# Patient Record
Sex: Female | Born: 1980 | Race: White | Hispanic: No | Marital: Married | Smoking: Never smoker
Health system: Southern US, Community
[De-identification: ages and names within clinical notes are randomized; demographics above are authoritative.]

## PROBLEM LIST (undated history)

## (undated) DIAGNOSIS — E079 Disorder of thyroid, unspecified: Secondary | ICD-10-CM

---

## 2004-06-18 ENCOUNTER — Observation Stay: Payer: Self-pay

## 2005-07-01 HISTORY — PX: CHOLECYSTECTOMY: SHX55

## 2007-01-21 ENCOUNTER — Ambulatory Visit: Payer: Self-pay | Admitting: Internal Medicine

## 2007-09-30 ENCOUNTER — Ambulatory Visit: Payer: Self-pay

## 2007-10-07 ENCOUNTER — Ambulatory Visit: Payer: Self-pay

## 2008-11-07 ENCOUNTER — Ambulatory Visit: Payer: Self-pay | Admitting: General Surgery

## 2009-05-02 HISTORY — PX: BREAST BIOPSY: SHX20

## 2010-02-07 ENCOUNTER — Encounter: Payer: Self-pay | Admitting: Specialist

## 2010-02-20 ENCOUNTER — Other Ambulatory Visit: Payer: Self-pay

## 2010-03-01 ENCOUNTER — Encounter: Payer: Self-pay | Admitting: Specialist

## 2010-03-31 ENCOUNTER — Encounter: Payer: Self-pay | Admitting: Specialist

## 2010-08-30 ENCOUNTER — Ambulatory Visit: Payer: Self-pay | Admitting: General Practice

## 2010-08-30 ENCOUNTER — Other Ambulatory Visit: Payer: Self-pay | Admitting: Physician Assistant

## 2010-10-01 ENCOUNTER — Other Ambulatory Visit: Payer: Self-pay

## 2010-11-06 ENCOUNTER — Ambulatory Visit: Payer: Self-pay | Admitting: Unknown Physician Specialty

## 2010-11-08 ENCOUNTER — Ambulatory Visit: Payer: Self-pay | Admitting: Unknown Physician Specialty

## 2012-12-03 ENCOUNTER — Emergency Department: Payer: Self-pay | Admitting: Emergency Medicine

## 2012-12-05 LAB — BETA STREP CULTURE(ARMC)

## 2012-12-26 ENCOUNTER — Inpatient Hospital Stay: Payer: Self-pay

## 2012-12-26 LAB — CBC WITH DIFFERENTIAL/PLATELET
Basophil #: 0.1 10*3/uL (ref 0.0–0.1)
Basophil %: 0.8 %
HCT: 31 % — ABNORMAL LOW (ref 35.0–47.0)
HGB: 10.3 g/dL — ABNORMAL LOW (ref 12.0–16.0)
Lymphocyte #: 2.4 10*3/uL (ref 1.0–3.6)
MCH: 25 pg — ABNORMAL LOW (ref 26.0–34.0)
MCV: 75 fL — ABNORMAL LOW (ref 80–100)
Monocyte %: 6.6 %
Neutrophil %: 65.3 %
Platelet: 246 10*3/uL (ref 150–440)
RBC: 4.14 10*6/uL (ref 3.80–5.20)
RDW: 15.5 % — ABNORMAL HIGH (ref 11.5–14.5)

## 2013-01-10 LAB — HM PAP SMEAR: HM Pap smear: NORMAL

## 2013-03-10 ENCOUNTER — Other Ambulatory Visit: Payer: Self-pay | Admitting: Obstetrics and Gynecology

## 2013-03-10 LAB — TSH: Thyroid Stimulating Horm: 0.023 u[IU]/mL — ABNORMAL LOW

## 2013-03-10 LAB — T4, FREE: Free Thyroxine: 1.43 ng/dL (ref 0.76–1.46)

## 2013-05-31 ENCOUNTER — Other Ambulatory Visit: Payer: Self-pay | Admitting: Obstetrics and Gynecology

## 2013-09-24 ENCOUNTER — Other Ambulatory Visit: Payer: Self-pay | Admitting: Obstetrics and Gynecology

## 2013-09-24 LAB — TSH: Thyroid Stimulating Horm: 6.57 u[IU]/mL — ABNORMAL HIGH

## 2013-09-24 LAB — T4, FREE: Free Thyroxine: 0.99 ng/dL (ref 0.76–1.46)

## 2013-12-03 ENCOUNTER — Other Ambulatory Visit: Payer: Self-pay | Admitting: Obstetrics and Gynecology

## 2013-12-03 LAB — TSH: Thyroid Stimulating Horm: 0.958 u[IU]/mL

## 2013-12-03 LAB — T4, FREE: Free Thyroxine: 1.39 ng/dL (ref 0.76–1.46)

## 2014-06-16 ENCOUNTER — Ambulatory Visit: Payer: Self-pay | Admitting: Family Medicine

## 2014-06-16 LAB — LIPID PANEL
Cholesterol: 151 mg/dL (ref 0–200)
HDL Cholesterol: 49 mg/dL (ref 40–60)
Ldl Cholesterol, Calc: 69 mg/dL (ref 0–100)
TRIGLYCERIDES: 164 mg/dL (ref 0–200)
VLDL Cholesterol, Calc: 33 mg/dL (ref 5–40)

## 2014-06-16 LAB — CBC WITH DIFFERENTIAL/PLATELET
BASOS PCT: 0.7 %
Basophil #: 0.1 10*3/uL (ref 0.0–0.1)
Eosinophil #: 0.2 10*3/uL (ref 0.0–0.7)
Eosinophil %: 2.5 %
HCT: 39.5 % (ref 35.0–47.0)
HGB: 13.4 g/dL (ref 12.0–16.0)
LYMPHS PCT: 40.5 %
Lymphocyte #: 3.1 10*3/uL (ref 1.0–3.6)
MCH: 29.3 pg (ref 26.0–34.0)
MCHC: 33.8 g/dL (ref 32.0–36.0)
MCV: 87 fL (ref 80–100)
Monocyte #: 0.5 x10 3/mm (ref 0.2–0.9)
Monocyte %: 6.2 %
NEUTROS ABS: 3.8 10*3/uL (ref 1.4–6.5)
Neutrophil %: 50.1 %
Platelet: 326 10*3/uL (ref 150–440)
RBC: 4.56 10*6/uL (ref 3.80–5.20)
RDW: 13.6 % (ref 11.5–14.5)
WBC: 7.7 10*3/uL (ref 3.6–11.0)

## 2014-06-16 LAB — TSH: THYROID STIMULATING HORM: 0.718 u[IU]/mL

## 2014-10-04 ENCOUNTER — Encounter: Payer: Self-pay | Admitting: *Deleted

## 2015-04-12 ENCOUNTER — Encounter: Payer: Self-pay | Admitting: Physician Assistant

## 2015-04-12 ENCOUNTER — Ambulatory Visit: Payer: Self-pay | Admitting: Family

## 2015-04-12 VITALS — BP 126/77 | Temp 98.7°F | Wt 185.0 lb

## 2015-04-12 DIAGNOSIS — J069 Acute upper respiratory infection, unspecified: Secondary | ICD-10-CM

## 2015-04-12 NOTE — Progress Notes (Signed)
S/ acute onset of cold sxs x 2 days , sneezing , rhinorhea , eyes watering, malaise, took the flu shot last week Denies fever chill, GI or GU sxs  O/  VSS mildly ill appearing , ENT tms clear, nasal mucosa boggy with increased clear mucous, pharynx injected Neck supple  Without nodes, Heart rsr lungs clear A/ uri  P / note for home rest of day , She will be off until next week. Supportive measures .

## 2015-05-08 ENCOUNTER — Encounter: Payer: Self-pay | Admitting: Physician Assistant

## 2015-05-08 ENCOUNTER — Ambulatory Visit: Payer: Self-pay | Admitting: Family

## 2015-05-08 VITALS — BP 110/60 | HR 100 | Temp 97.8°F

## 2015-05-08 DIAGNOSIS — L247 Irritant contact dermatitis due to plants, except food: Secondary | ICD-10-CM

## 2015-05-08 MED ORDER — PREDNISONE 10 MG (48) PO TBPK: ORAL_TABLET | Freq: Every day | ORAL | Status: DC

## 2015-05-08 NOTE — Progress Notes (Signed)
S cutting wood a week ago , breaking out to arms and legs getting worse, very pruritic  , has a prior hx of allergy to poison but did not see any, tried topicals without relief  O/ red papulovesicular lesions in crops flexor surfaces of elbows , forearms and inner thighs  A/ contact derm P/sterapred pack x 12 days with instructions. Cut nails.

## 2015-06-09 ENCOUNTER — Other Ambulatory Visit
Admission: RE | Admit: 2015-06-09 | Discharge: 2015-06-09 | Disposition: A | Discharge status: Home / Self Care | Payer: 59 | Source: Ambulatory Visit | Attending: Obstetrics and Gynecology | Admitting: Obstetrics and Gynecology

## 2015-06-09 DIAGNOSIS — E039 Hypothyroidism, unspecified: Secondary | ICD-10-CM | POA: Diagnosis not present

## 2015-06-09 LAB — T4, FREE: Free T4: 0.94 ng/dL (ref 0.61–1.12)

## 2015-06-09 LAB — TSH: TSH: 2.645 u[IU]/mL (ref 0.350–4.500)

## 2015-07-27 ENCOUNTER — Other Ambulatory Visit
Admission: RE | Admit: 2015-07-27 | Discharge: 2015-07-27 | Disposition: A | Discharge status: Home / Self Care | Payer: 59 | Source: Ambulatory Visit | Attending: Obstetrics and Gynecology | Admitting: Obstetrics and Gynecology

## 2015-07-27 ENCOUNTER — Other Ambulatory Visit: Payer: Self-pay | Admitting: Obstetrics and Gynecology

## 2015-07-27 ENCOUNTER — Other Ambulatory Visit: Admit: 2015-07-27 | Payer: Self-pay | Source: Ambulatory Visit | Admitting: Obstetrics and Gynecology

## 2015-07-27 DIAGNOSIS — E039 Hypothyroidism, unspecified: Secondary | ICD-10-CM | POA: Diagnosis not present

## 2015-07-27 LAB — TSH: TSH: 0.793 u[IU]/mL (ref 0.350–4.500)

## 2015-07-27 LAB — T4, FREE: FREE T4: 1.14 ng/dL — AB (ref 0.61–1.12)

## 2015-08-16 ENCOUNTER — Encounter: Payer: Self-pay | Admitting: Physician Assistant

## 2015-08-16 ENCOUNTER — Ambulatory Visit: Payer: Self-pay | Admitting: Physician Assistant

## 2015-08-16 VITALS — BP 122/84 | HR 80 | Temp 98.2°F

## 2015-08-16 DIAGNOSIS — J029 Acute pharyngitis, unspecified: Secondary | ICD-10-CM

## 2015-08-16 NOTE — Progress Notes (Signed)
S/ 2 days hx ST / fever  Without any resp or gi sxs  Also tired and having abn bleeding from implant  son had mono   O/ alert pleasant mildly ill appearing NAD , ENT unremarkable , throat mildly injected , neck supple without nodes heart rsr lungs clear  A/ probable viral illness  P/ Supportive measures discussed. Follow up prn not improving

## 2015-10-19 ENCOUNTER — Encounter: Payer: Self-pay | Admitting: General Surgery

## 2015-10-19 ENCOUNTER — Ambulatory Visit (INDEPENDENT_AMBULATORY_CARE_PROVIDER_SITE_OTHER): Payer: 59 | Admitting: General Surgery

## 2015-10-19 VITALS — BP 124/78 | HR 76 | Resp 12 | Ht 71.0 in | Wt 192.0 lb

## 2015-10-19 DIAGNOSIS — Z459 Encounter for adjustment and management of unspecified implanted device: Secondary | ICD-10-CM

## 2015-10-19 NOTE — Progress Notes (Signed)
This is a 35 year old female here today for implant removal left forearm. She reports a lot of side effects from the implant and has decided to have it removed. I have reviewed the history of present illness with the patient.  3 ml of 1% xylocaine mixed with o.5% marcaine was used. Area prepped with chloro prep. 5mm incision made at lower end of the palpable implant. The implant was removed without any difficulty. Skin closed with 4-0 vicryl, steristrips , dressed with telfa and tegaderm No immediate problems from procedure   PCP:  Jean RosenthalJackson This information has been scribed by Dorathy DaftMarsha Hatch RN, BSN,BC.

## 2015-10-21 ENCOUNTER — Encounter: Payer: Self-pay | Admitting: General Surgery

## 2015-12-04 ENCOUNTER — Ambulatory Visit: Payer: Self-pay | Admitting: Physician Assistant

## 2015-12-04 ENCOUNTER — Encounter: Payer: Self-pay | Admitting: Physician Assistant

## 2015-12-04 VITALS — BP 114/84 | HR 78 | Temp 97.8°F

## 2015-12-04 DIAGNOSIS — L259 Unspecified contact dermatitis, unspecified cause: Secondary | ICD-10-CM

## 2015-12-04 MED ORDER — DEXAMETHASONE SODIUM PHOSPHATE 10 MG/ML IJ SOLN
10.0000 mg | Freq: Once | INTRAMUSCULAR | Status: AC
  Administered 2015-12-04: 10 mg via INTRAMUSCULAR

## 2015-12-04 NOTE — Progress Notes (Signed)
S: c/o itchy rash on hands, arms, legs, husband was outside in yard and she touched his clothing,  then she broke out, sx for few days, tried multiple otc meds without relief, denies fever/chills  O: vitals wnl, nad, lungs c t a, cv rrr, skin with small raised red areas some with streaks/blisters, no drainage, n/v intact  A: acute contact dermatitis  P: decadron 10mg  im

## 2015-12-07 ENCOUNTER — Telehealth: Payer: Self-pay | Admitting: Physician Assistant

## 2015-12-07 MED ORDER — PREDNISONE 10 MG (48) PO TBPK: ORAL_TABLET | Freq: Every day | ORAL | Status: DC

## 2015-12-07 NOTE — Telephone Encounter (Signed)
Pt seen for poison ivy, given decadron but decadron not helping, called in 12d steroid pack to [facility]

## 2015-12-21 ENCOUNTER — Other Ambulatory Visit: Payer: Self-pay | Admitting: General Surgery

## 2015-12-21 DIAGNOSIS — N390 Urinary tract infection, site not specified: Secondary | ICD-10-CM

## 2015-12-21 MED ORDER — CIPROFLOXACIN HCL 500 MG PO TABS: 500.0000 mg | ORAL_TABLET | Freq: Two times a day (BID) | ORAL | Status: AC

## 2015-12-21 MED ORDER — PHENAZOPYRIDINE HCL 200 MG PO TABS: 200.0000 mg | ORAL_TABLET | Freq: Three times a day (TID) | ORAL | Status: DC | PRN

## 2016-01-01 DIAGNOSIS — S86811A Strain of other muscle(s) and tendon(s) at lower leg level, right leg, initial encounter: Secondary | ICD-10-CM | POA: Diagnosis not present

## 2016-01-03 ENCOUNTER — Other Ambulatory Visit: Payer: Self-pay | Admitting: Specialist

## 2016-01-03 DIAGNOSIS — S86911A Strain of unspecified muscle(s) and tendon(s) at lower leg level, right leg, initial encounter: Secondary | ICD-10-CM

## 2016-01-17 ENCOUNTER — Ambulatory Visit
Admission: RE | Admit: 2016-01-17 | Discharge: 2016-01-17 | Disposition: A | Discharge status: Home / Self Care | Payer: 59 | Source: Ambulatory Visit | Attending: Specialist | Admitting: Specialist

## 2016-01-17 DIAGNOSIS — S82291A Other fracture of shaft of right tibia, initial encounter for closed fracture: Secondary | ICD-10-CM | POA: Diagnosis not present

## 2016-01-17 DIAGNOSIS — S86911A Strain of unspecified muscle(s) and tendon(s) at lower leg level, right leg, initial encounter: Secondary | ICD-10-CM | POA: Diagnosis not present

## 2016-01-17 DIAGNOSIS — M2241 Chondromalacia patellae, right knee: Secondary | ICD-10-CM | POA: Diagnosis not present

## 2016-01-17 DIAGNOSIS — M25561 Pain in right knee: Secondary | ICD-10-CM | POA: Diagnosis not present

## 2016-01-31 DIAGNOSIS — S86811D Strain of other muscle(s) and tendon(s) at lower leg level, right leg, subsequent encounter: Secondary | ICD-10-CM | POA: Diagnosis not present

## 2016-02-28 DIAGNOSIS — S82144D Nondisplaced bicondylar fracture of right tibia, subsequent encounter for closed fracture with routine healing: Secondary | ICD-10-CM | POA: Diagnosis not present

## 2016-06-20 ENCOUNTER — Ambulatory Visit (INDEPENDENT_AMBULATORY_CARE_PROVIDER_SITE_OTHER): Payer: 59 | Admitting: General Surgery

## 2016-06-20 ENCOUNTER — Inpatient Hospital Stay: Payer: Self-pay

## 2016-06-20 ENCOUNTER — Other Ambulatory Visit
Admission: RE | Admit: 2016-06-20 | Discharge: 2016-06-20 | Disposition: A | Discharge status: Home / Self Care | Payer: 59 | Source: Ambulatory Visit | Attending: General Surgery | Admitting: General Surgery

## 2016-06-20 ENCOUNTER — Encounter: Payer: Self-pay | Admitting: General Surgery

## 2016-06-20 VITALS — BP 120/72 | HR 78 | Resp 12 | Ht 71.0 in | Wt 192.0 lb

## 2016-06-20 DIAGNOSIS — E038 Other specified hypothyroidism: Secondary | ICD-10-CM | POA: Diagnosis not present

## 2016-06-20 DIAGNOSIS — N6310 Unspecified lump in the right breast, unspecified quadrant: Secondary | ICD-10-CM

## 2016-06-20 LAB — T4, FREE: FREE T4: 0.83 ng/dL (ref 0.61–1.12)

## 2016-06-20 LAB — TSH: TSH: 3.753 u[IU]/mL (ref 0.350–4.500)

## 2016-06-20 MED ORDER — LEVOTHYROXINE SODIUM 100 MCG PO TABS: 100.0000 ug | ORAL_TABLET | Freq: Every day | ORAL | 4 refills | Status: DC

## 2016-06-20 NOTE — Patient Instructions (Addendum)
  Patient to return in four months for right breast ultrasound and this four months bilateral screening mammogram

## 2016-06-20 NOTE — Progress Notes (Signed)
Patient ID: Sharon SartoriusJamie L Leyendecker, female   DOB: 04/15/1981, 35 y.o.   MRN: [id number]  Chief Complaint  Patient presents with  . Follow-up    breast mass    HPI Sharon SartoriusJamie L Doring is a 35 y.o. female here today for a evaluation of a right  breast mass. Patient noticed this area three weeks ago. No change in size or pain. Also reports she has not been taking her synthroid since August after her last prescription expired She is hypothyroid and also had a tiny thyroid nodule/cyst. I have reviewed the history of present illness with the patient.  HPI  History reviewed. No pertinent past medical history.  Past Surgical History:  Procedure Laterality Date  . BREAST BIOPSY Left 05/02/2009  . CHOLECYSTECTOMY  2007    History reviewed. No pertinent family history.  Social History Social History  Substance Use Topics  . Smoking status: Never Smoker  . Smokeless tobacco: Never Used  . Alcohol use 0.0 oz/week    Allergies  Allergen Reactions  . Zithromax [Azithromycin] Swelling and Other (See Comments)    Water blister    Current Outpatient Prescriptions  Medication Sig Dispense Refill  . levothyroxine (SYNTHROID, LEVOTHROID) 100 MCG tablet Take 1 tablet (100 mcg total) by mouth daily before breakfast. 30 tablet 4   No current facility-administered medications for this visit.     Review of Systems Review of Systems  Constitutional: Negative.   Respiratory: Negative.   Cardiovascular: Negative.     Blood pressure 120/72, pulse 78, resp. rate 12, height 5\' 11"  (1.803 m), weight 192 lb (87.1 kg).  Physical Exam Physical Exam  Constitutional: She is oriented to person, place, and time. She appears well-developed and well-nourished.  Eyes: Conjunctivae are normal. No scleral icterus.  Neck: Neck supple.  Cardiovascular: Normal rate, regular rhythm and normal heart sounds.   Pulmonary/Chest: Right breast exhibits mass. Right breast exhibits no inverted nipple, no nipple discharge, no  skin change and no tenderness. Left breast exhibits no inverted nipple, no mass, no nipple discharge, no skin change and no tenderness.    Lymphadenopathy:    She has no cervical adenopathy.    She has no axillary adenopathy.  Neurological: She is alert and oriented to person, place, and time.  Skin: Skin is warm and dry.    Data Reviewed Prior notes. Had FNAof a fibrotic mass in 2010.  Assessment   Right breast mass   Right breast ultrasound performed targeted over the palpable finding 11.30 ocl 5cmfn. No definite finding  noted but there appears to be a tiny hypoechoic area with central hilum suggestive of a lymph node. It measures max of 0,67cm. It is seen best on radial view. This finding is likely benign. 4 month follow up exam and US recommended Discussed fully with pt.  Plan   Rx sent for Synthroid 100mcg daily. Free T4 and TSH levels to be drawn today Patient to return in four months for right breast ul;trasound and this four months bilateral screening mammogram .  This information has been scribed by Ples SpecterJessica Qualls CMA.        Damyn Weitzel G 06/20/2016, 2:58 PM

## 2016-06-26 ENCOUNTER — Other Ambulatory Visit: Payer: Self-pay | Admitting: *Deleted

## 2016-06-26 ENCOUNTER — Encounter: Payer: Self-pay | Admitting: *Deleted

## 2016-06-26 DIAGNOSIS — E038 Other specified hypothyroidism: Secondary | ICD-10-CM

## 2016-07-25 ENCOUNTER — Other Ambulatory Visit
Admission: RE | Admit: 2016-07-25 | Discharge: 2016-07-25 | Disposition: A | Discharge status: Home / Self Care | Payer: 59 | Source: Ambulatory Visit | Attending: General Surgery | Admitting: General Surgery

## 2016-07-25 DIAGNOSIS — E038 Other specified hypothyroidism: Secondary | ICD-10-CM | POA: Diagnosis not present

## 2016-07-25 LAB — T4, FREE: Free T4: 1.26 ng/dL — ABNORMAL HIGH (ref 0.61–1.12)

## 2016-07-25 LAB — TSH: TSH: 0.743 u[IU]/mL (ref 0.350–4.500)

## 2016-07-30 ENCOUNTER — Telehealth: Payer: Self-pay | Admitting: *Deleted

## 2016-07-30 DIAGNOSIS — E038 Other specified hypothyroidism: Secondary | ICD-10-CM

## 2016-07-30 NOTE — Telephone Encounter (Signed)
-----   Message from Kieth BrightlySeeplaputhur G Sankar, MD sent at 07/29/2016  3:28 PM EST ----- Repeat TSH and free T4 prior to her next visit

## 2016-07-30 NOTE — Telephone Encounter (Signed)
Notified patient as instructed, patient pleased. Discussed follow-up appointments, patient agrees Lab form mailed to pt aware to have test drawn one week prior to appt

## 2016-10-07 ENCOUNTER — Other Ambulatory Visit
Admission: RE | Admit: 2016-10-07 | Discharge: 2016-10-07 | Disposition: A | Discharge status: Home / Self Care | Payer: 59 | Source: Ambulatory Visit | Attending: General Surgery | Admitting: General Surgery

## 2016-10-07 ENCOUNTER — Other Ambulatory Visit: Payer: Self-pay | Admitting: *Deleted

## 2016-10-07 DIAGNOSIS — E038 Other specified hypothyroidism: Secondary | ICD-10-CM

## 2016-10-17 ENCOUNTER — Other Ambulatory Visit
Admission: RE | Admit: 2016-10-17 | Discharge: 2016-10-17 | Disposition: A | Discharge status: Home / Self Care | Payer: 59 | Source: Ambulatory Visit | Attending: General Surgery | Admitting: General Surgery

## 2016-10-17 DIAGNOSIS — E038 Other specified hypothyroidism: Secondary | ICD-10-CM

## 2016-10-17 LAB — TSH: TSH: 0.824 u[IU]/mL (ref 0.350–4.500)

## 2016-10-17 LAB — T4, FREE: Free T4: 1.24 ng/dL — ABNORMAL HIGH (ref 0.61–1.12)

## 2016-10-22 ENCOUNTER — Ambulatory Visit (INDEPENDENT_AMBULATORY_CARE_PROVIDER_SITE_OTHER): Payer: 59 | Admitting: General Surgery

## 2016-10-22 ENCOUNTER — Inpatient Hospital Stay: Payer: Self-pay

## 2016-10-22 ENCOUNTER — Encounter: Payer: Self-pay | Admitting: General Surgery

## 2016-10-22 VITALS — BP 120/76 | HR 80 | Resp 12 | Ht 71.0 in | Wt 177.0 lb

## 2016-10-22 DIAGNOSIS — N6310 Unspecified lump in the right breast, unspecified quadrant: Secondary | ICD-10-CM

## 2016-10-22 DIAGNOSIS — E079 Disorder of thyroid, unspecified: Secondary | ICD-10-CM

## 2016-10-22 DIAGNOSIS — E038 Other specified hypothyroidism: Secondary | ICD-10-CM

## 2016-10-22 NOTE — Patient Instructions (Signed)
The patient is aware to call back for any questions or concerns.  

## 2016-10-22 NOTE — Progress Notes (Signed)
Patient ID: Sharon Weber, female   DOB: Dec 10, 1980, 36 y.o.   MRN: [id number]  Chief Complaint  Patient presents with  . Follow-up    breast u/s and thyroid u/s    HPI [name] is a 36 y.o. female here today for a right breast and thyroid ultrasound. Patient states she is doing well.  Follow up from a right  breast mass she noted 4 months ago.  She is hypothyroid and also had a small thyroid nodule/cyst.    HPI  No past medical history on file.  Past Surgical History:  Procedure Laterality Date  . BREAST BIOPSY Left 05/02/2009  . CHOLECYSTECTOMY  2007    No family history on file.  Social History Social History  Substance Use Topics  . Smoking status: Never Smoker  . Smokeless tobacco: Never Used  . Alcohol use 0.0 oz/week    Allergies  Allergen Reactions  . Zithromax [Azithromycin] Swelling and Other (See Comments)    Water blister    Current Outpatient Prescriptions  Medication Sig Dispense Refill  . levothyroxine (SYNTHROID, LEVOTHROID) 100 MCG tablet Take 1 tablet (100 mcg total) by mouth daily before breakfast. 30 tablet 4   No current facility-administered medications for this visit.     Review of Systems Review of Systems  Constitutional: Negative.   Respiratory: Negative.   Cardiovascular: Negative.     Blood pressure 120/76, pulse 80, resp. rate 12, height  (1.803 m), weight 177 lb (80.3 kg), last menstrual period 10/01/2016.  Physical Exam Physical Exam  Constitutional: She is oriented to person, place, and time. She appears well-developed and well-nourished.  HENT:  Mouth/Throat: Oropharynx is clear and moist.  Eyes: Conjunctivae are normal. No scleral icterus.  Neck: Neck supple.  Pulmonary/Chest: Right breast exhibits no inverted nipple, no mass, no nipple discharge, no skin change and no tenderness. Left breast exhibits no inverted nipple, no mass, no nipple discharge, no skin change and no tenderness.    Lymphadenopathy:   She has no cervical adenopathy.    She has no axillary adenopathy.  Neurological: She is alert and oriented to person, place, and time.  Skin: Skin is warm and dry.  Psychiatric: Her behavior is normal.    Data Reviewed Prior notes reviewed TSH and FT4 levels are stable Assessment    Ultrasound in office showed 2 adjacent hypoechoic nodules on right lobe of thyroid, with maximum measurement of 0.7 cm. Likely benign, but recommending FNA and biopsy.   Small palpable mass at 11:30 of right breast. Ultrasound today consistent with previous findings: small hypoechoic area with central hilum, suggestive of a lymph node. Will continue to monitor with baseline mammogram     Plan    Bilateral screening baseline mammogram to be scheduled for August of this year.    Schedule thyroid biopsy Affirma within the next few weeks.  Continue with synthroid at daily as prescribed.   HPI, Physical Exam, Assessment and Plan have been scribed under the direction and in the presence of Kathreen Cosier, MD  Dorathy Daft, RN  I have completed the exam and reviewed the above documentation for accuracy and completeness.  I agree with the above.  Museum/gallery conservator has been used and any errors in dictation or transcription are unintentional.  Yisell Sprunger G. Evette Weber, M.D., F.A.C.S.  Gerlene Burdock G 10/22/2016, 11:42 AM

## 2016-10-28 ENCOUNTER — Other Ambulatory Visit: Payer: Self-pay | Admitting: General Surgery

## 2016-10-28 ENCOUNTER — Ambulatory Visit (INDEPENDENT_AMBULATORY_CARE_PROVIDER_SITE_OTHER): Payer: 59 | Admitting: General Surgery

## 2016-10-28 ENCOUNTER — Inpatient Hospital Stay: Payer: Self-pay

## 2016-10-28 ENCOUNTER — Encounter: Payer: Self-pay | Admitting: General Surgery

## 2016-10-28 VITALS — Resp 12 | Ht 71.0 in | Wt 176.0 lb

## 2016-10-28 DIAGNOSIS — E079 Disorder of thyroid, unspecified: Secondary | ICD-10-CM | POA: Diagnosis not present

## 2016-10-28 NOTE — Progress Notes (Signed)
Patient ID: Sharon Weber, female   DOB: September 27, 1980, 36 y.o.   MRN: [id number]  Chief Complaint  Patient presents with  . Procedure    HPI Sharon Weber is a 36 y.o. female  Here today for a right thyroid biopsy.  HPI  No past medical history on file.  Past Surgical History:  Procedure Laterality Date  . BREAST BIOPSY Left 05/02/2009  . CHOLECYSTECTOMY  2007    No family history on file.  Social History Social History  Substance Use Topics  . Smoking status: Never Smoker  . Smokeless tobacco: Never Used  . Alcohol use 0.0 oz/week    Allergies  Allergen Reactions  . Zithromax [Azithromycin] Swelling and Other (See Comments)    Water blister    Current Outpatient Prescriptions  Medication Sig Dispense Refill  . levothyroxine (SYNTHROID, LEVOTHROID) 100 MCG tablet Take 1 tablet (100 mcg total) by mouth daily before breakfast. 30 tablet 4   No current facility-administered medications for this visit.     Review of Systems Review of Systems  Constitutional: Negative.   Respiratory: Negative.   Cardiovascular: Negative.     Resp. rate 12, height  (1.803 m), weight 176 lb (79.8 kg), last menstrual period 10/01/2016.  Physical Exam Physical Exam  Data Reviewed    Assessment    Right thyroid nodule    Plan    FNA of right thyroid nodule performed and sent as planned-Affirma   Right side neck was prepped with alcohol. With US guidance FNA of the nodule in mid right lobe was done using US guidance. 22 g needle used. 1 ml 1%Xylocaine was used at the needle entry site. 2 samples were obtained in both the was sent for the affirma  Testing Procedure was was tolerated with minimal discomfort  Will call with results when available.  If the cytology comes back as benign we'll plan on seeing her in August with a baseline screening mammogram and possible repeat ultrasound right breast for small lymph node     I have completed the exam and reviewed the above  documentation for accuracy and completeness.  I agree with the above.  Museum/gallery conservator has been used and any errors in dictation or transcription are unintentional.  Alyas Creary G. Evette Cristal, M.D., F.A.C.S.   Gerlene Burdock G 10/28/2016, 1:43 PM

## 2016-10-28 NOTE — Patient Instructions (Signed)
Will call with results

## 2016-10-29 NOTE — Addendum Note (Signed)
Addended by: Currie Paris on: 10/29/2016 09:57 AM   Modules accepted: Orders

## 2016-10-31 ENCOUNTER — Telehealth: Payer: Self-pay | Admitting: *Deleted

## 2016-10-31 NOTE — Telephone Encounter (Signed)
-----   Message from Kieth BrightlySeeplaputhur G Sankar, MD sent at 10/31/2016  8:49 AM EDT ----- Few follicular cells, not suspicious.  Will follow with repeat US on her August visit. Pt notified.

## 2017-01-07 ENCOUNTER — Telehealth: Payer: Self-pay | Admitting: *Deleted

## 2017-01-07 NOTE — Telephone Encounter (Signed)
[facility] for her synthroid 100 mcg #30 with 4 refills per [facility]. Done.

## 2017-03-05 ENCOUNTER — Encounter: Payer: Self-pay | Admitting: General Surgery

## 2017-03-06 ENCOUNTER — Ambulatory Visit: Payer: 59 | Admitting: General Surgery

## 2017-03-10 ENCOUNTER — Encounter: Payer: Self-pay | Admitting: General Surgery

## 2017-03-10 ENCOUNTER — Ambulatory Visit (INDEPENDENT_AMBULATORY_CARE_PROVIDER_SITE_OTHER): Payer: BC Managed Care – PPO | Admitting: General Surgery

## 2017-03-10 ENCOUNTER — Inpatient Hospital Stay: Payer: Self-pay

## 2017-03-10 VITALS — BP 122/84 | HR 90 | Resp 12 | Ht 71.0 in | Wt 190.0 lb

## 2017-03-10 DIAGNOSIS — E079 Disorder of thyroid, unspecified: Secondary | ICD-10-CM

## 2017-03-10 DIAGNOSIS — N6311 Unspecified lump in the right breast, upper outer quadrant: Secondary | ICD-10-CM

## 2017-03-10 DIAGNOSIS — E038 Other specified hypothyroidism: Secondary | ICD-10-CM | POA: Diagnosis not present

## 2017-03-10 DIAGNOSIS — N6310 Unspecified lump in the right breast, unspecified quadrant: Secondary | ICD-10-CM

## 2017-03-10 MED ORDER — PREDNISONE 10 MG (48) PO TBPK: ORAL_TABLET | ORAL | 3 refills | Status: DC

## 2017-03-10 MED ORDER — LEVOTHYROXINE SODIUM 100 MCG PO TABS: 100.0000 ug | ORAL_TABLET | Freq: Every day | ORAL | 4 refills | Status: DC

## 2017-03-10 NOTE — Progress Notes (Signed)
Patient ID: Sharon Weber, female   DOB: March 17, 1981, 36 y.o.   MRN: [id number]  Chief Complaint  Patient presents with  . Follow-up    mammogram    HPI Sharon Weber is a 36 y.o. female who presents for a breast evaluation and thyroid US. The most recent mammogram was done on 03/04/17.  Patient does perform regular self breast checks and gets regular mammograms done. Patient states no new changes to her breast. Family history is significant for maternal aunt with breast cancer.   HPI  No past medical history on file.  Past Surgical History:  Procedure Laterality Date  . BREAST BIOPSY Left 05/02/2009  . CHOLECYSTECTOMY  2007    No family history on file.  Social History Social History  Substance Use Topics  . Smoking status: Never Smoker  . Smokeless tobacco: Never Used  . Alcohol use 0.0 oz/week    Allergies  Allergen Reactions  . Zithromax [Azithromycin] Swelling and Other (See Comments)    Water blister    Current Outpatient Prescriptions  Medication Sig Dispense Refill  . levothyroxine (SYNTHROID, LEVOTHROID) 100 MCG tablet Take 1 tablet (100 mcg total) by mouth daily before breakfast. 90 tablet 4  . meloxicam (MOBIC) 15 MG tablet meloxicam 15 mg tablet  Take 1 tablet every day by oral route with meals.    . predniSONE (STERAPRED UNI-PAK 48 TAB) 10 MG (48) TBPK tablet Days 1-4: Two tablets before breakfast, one after lunch, one after dinner, and two at bedtime. If started late in the day, take two tablets every hour for three hours, unless otherwise directed by prescriber. Days 5-8: One tablet before breakfast, one after lunch, one after dinner, and one at bedtime Days 9-12: One tablet before breakfast and one at bedtime 48 tablet 3   No current facility-administered medications for this visit.     Review of Systems Review of Systems  Constitutional: Negative.   Respiratory: Negative.   Cardiovascular: Negative.     Blood pressure 122/84, pulse 90, resp.  rate 12, height  (1.803 m), weight 190 lb (86.2 kg), last menstrual period 02/11/2017.  Physical Exam Physical Exam  Constitutional: She is oriented to person, place, and time. She appears well-developed and well-nourished.  Eyes: Conjunctivae are normal. No scleral icterus.  Neck: Neck supple. No tracheal deviation present. No thyromegaly present.  Cardiovascular: Normal rate and regular rhythm.   Pulmonary/Chest:  No palpable masses over right and left breasts.  Lymphadenopathy:    She has no cervical adenopathy.    She has no axillary adenopathy.  Neurological: She is alert and oriented to person, place, and time.  Skin: Skin is warm and dry.    Data Reviewed Mammogram and prior notes reviewed  Assessment    Thyroid mass- Ultrasound today displays two nodules adjacent to one another measuring approximately 0.79 cm in the middle right lobe. They have not significantly changed since last [place] on 10/22/2016 where they measured approximately 0.74 cm.   Right upper outer breast mass - no mass visualized on [place] today.     Plan     Obtain T4 and TSH. Continue Synthroid 100 mcg.  Recheck thyroid nodule in 3 mos and if stable, can be checked once a yr thereafter Given negative breast US today, there is no need for breast follow up. Patient can resume routine mammograms at age 70.     The patient is aware to call back for any questions or concerns.  HPI, Physical Exam,  Assessment and Plan have been scribed under the direction and in the presence of Kathreen CosierS. G. Sankar, MD.  Milas Kocherebeca Morris, CMA   I have completed the exam and reviewed the above documentation for accuracy and completeness.  I agree with the above.  Museum/gallery conservatorDragon Technology has been used and any errors in dictation or transcription are unintentional.  Seeplaputhur G. Evette CristalSankar, M.D., F.A.C.S.   Gerlene BurdockSANKAR,SEEPLAPUTHUR G 03/10/2017, 11:21 AM

## 2017-03-10 NOTE — Patient Instructions (Signed)
Obtain T4 and TSH. Continue Synthroid.  No need for breast follow up. Patient can resume routine mammograms at age 36

## 2017-03-11 LAB — T4 AND TSH
T4 TOTAL: 10.1 ug/dL (ref 4.5–12.0)
TSH: 1.27 u[IU]/mL (ref 0.450–4.500)

## 2017-03-25 ENCOUNTER — Emergency Department: Payer: BC Managed Care – PPO

## 2017-03-25 ENCOUNTER — Encounter: Payer: Self-pay | Admitting: Intensive Care

## 2017-03-25 ENCOUNTER — Emergency Department
Admission: EM | Admit: 2017-03-25 | Discharge: 2017-03-25 | Disposition: A | Discharge status: Home / Self Care | Payer: BC Managed Care – PPO | Attending: Emergency Medicine | Admitting: Emergency Medicine

## 2017-03-25 DIAGNOSIS — G588 Other specified mononeuropathies: Secondary | ICD-10-CM | POA: Insufficient documentation

## 2017-03-25 DIAGNOSIS — M62838 Other muscle spasm: Secondary | ICD-10-CM | POA: Insufficient documentation

## 2017-03-25 DIAGNOSIS — S149XXA Injury of unspecified nerves of neck, initial encounter: Secondary | ICD-10-CM

## 2017-03-25 DIAGNOSIS — G542 Cervical root disorders, not elsewhere classified: Secondary | ICD-10-CM | POA: Diagnosis not present

## 2017-03-25 DIAGNOSIS — M542 Cervicalgia: Secondary | ICD-10-CM | POA: Diagnosis not present

## 2017-03-25 DIAGNOSIS — G589 Mononeuropathy, unspecified: Secondary | ICD-10-CM

## 2017-03-25 HISTORY — DX: Disorder of thyroid, unspecified: E07.9

## 2017-03-25 MED ORDER — DIAZEPAM 5 MG PO TABS
5.0000 mg | ORAL_TABLET | Freq: Once | ORAL | Status: AC
  Administered 2017-03-25: 5 mg via ORAL
  Filled 2017-03-25: qty 1

## 2017-03-25 MED ORDER — PREDNISONE 10 MG PO TABS: ORAL_TABLET | ORAL | 0 refills | Status: DC

## 2017-03-25 MED ORDER — KETOROLAC TROMETHAMINE 30 MG/ML IJ SOLN
30.0000 mg | Freq: Once | INTRAMUSCULAR | Status: AC
  Administered 2017-03-25: 30 mg via INTRAMUSCULAR
  Filled 2017-03-25: qty 1

## 2017-03-25 MED ORDER — CYCLOBENZAPRINE HCL 5 MG PO TABS: 5.0000 mg | ORAL_TABLET | Freq: Three times a day (TID) | ORAL | 0 refills | Status: AC | PRN

## 2017-03-25 MED ORDER — LIDOCAINE 5 % EX PTCH
1.0000 | MEDICATED_PATCH | CUTANEOUS | Status: DC
  Administered 2017-03-25: 1 via TRANSDERMAL
  Filled 2017-03-25: qty 1

## 2017-03-25 MED ORDER — MELOXICAM 15 MG PO TABS: 15.0000 mg | ORAL_TABLET | Freq: Every day | ORAL | 0 refills | Status: AC

## 2017-03-25 MED ORDER — OXYCODONE-ACETAMINOPHEN 5-325 MG PO TABS
1.0000 | ORAL_TABLET | Freq: Once | ORAL | Status: AC
  Administered 2017-03-25: 1 via ORAL
  Filled 2017-03-25: qty 1

## 2017-03-25 MED ORDER — TRAMADOL HCL 50 MG PO TABS: 50.0000 mg | ORAL_TABLET | Freq: Four times a day (QID) | ORAL | 0 refills | Status: DC | PRN

## 2017-03-25 NOTE — ED Provider Notes (Signed)
[facility] Emergency Department Provider Note  ____________________________________________  Time seen: Approximately 2:49 PM  I have reviewed the triage vital signs and the nursing notes.   HISTORY  Chief Complaint Neck Pain    HPI [name] is a 36 y.o. female that presents to emergency with left-sided neck pain for one day. Patient woke up with pain this morning. It is primary painful to turn neck to the left.She states that it hurts so bad that she had an episode of tunnel vision this morning. She is also had some tingling in her left shoulder. She has had left shoulder pain for a long time and has been evaluated by orthopedics but this pain is worse. No recent illness. No fever, headache, shortness of breath, chest pain, nausea, vomiting, abdominal pain.   Past Medical History:  Diagnosis Date  . Thyroid disease     There are no active problems to display for this patient.   Past Surgical History:  Procedure Laterality Date  . BREAST BIOPSY Left 05/02/2009  . CHOLECYSTECTOMY  2007    Prior to Admission medications   Medication Sig Start Date End Date Taking? Authorizing Provider  cyclobenzaprine (FLEXERIL) 5 MG tablet Take 1 tablet (5 mg total) by mouth 3 (three) times daily as needed for muscle spasms. 03/25/17 04/01/17  Enid Derry, PA-C  levothyroxine (SYNTHROID, LEVOTHROID) 100 MCG tablet Take 1 tablet (100 mcg total) by mouth daily before breakfast. 03/10/17   Kieth Brightly, MD  meloxicam (MOBIC) 15 MG tablet Take 1 tablet (15 mg total) by mouth daily. 03/25/17 04/04/17  Enid Derry, PA-C  predniSONE (DELTASONE) 10 MG tablet Take 6 tablets on day 1, take 5 tablets on day 2, take 4 tablets on day 3, take 3 tablets on day 4, take 2 tablets on day 5, take 1 tablet on day 6 03/25/17   Enid Derry, PA-C  traMADol (ULTRAM) 50 MG tablet Take 1 tablet (50 mg total) by mouth every 6 (six) hours as needed. 03/25/17 03/25/18  Enid Derry, PA-C    Allergies Zithromax [azithromycin]  History reviewed. No pertinent family history.  Social History Social History  Substance Use Topics  . Smoking status: Never Smoker  . Smokeless tobacco: Never Used  . Alcohol use 0.0 oz/week     Comment: occ     Review of Systems  Constitutional: No fever/chills Cardiovascular: No chest pain. Respiratory: No SOB. Gastrointestinal: No abdominal pain.  No nausea, no vomiting.  Musculoskeletal: Positive for neck pain. Skin: Negative for rash, abrasions, lacerations, ecchymosis.   ____________________________________________   PHYSICAL EXAM:  VITAL SIGNS: ED Triage Vitals  Enc Vitals Group     BP 03/25/17 1311 118/78     Pulse Rate 03/25/17 1311 88     Resp 03/25/17 1311 18     Temp 03/25/17 1311 99 F (37.2 C)     Temp Source 03/25/17 1311 Oral     SpO2 03/25/17 1311 98 %     Weight 03/25/17 1313 190 lb (86.2 kg)     Height 03/25/17 1313  (1.803 m)     Head Circumference --      Peak Flow --      Pain Score 03/25/17 1313 8     Pain Loc --      Pain Edu? --      Excl. in GC? --      Constitutional: Alert and oriented. Well appearing and in no acute distress. Eyes: Conjunctivae are normal.  PERRL. EOMI. Head: Atraumatic. ENT:      Ears:      Nose: No congestion/rhinnorhea.      Mouth/Throat: Mucous membranes are moist.  Neck: No stridor.  No cervical spine tenderness to palpation. Full flexion and extension of neck. Pain with left rotation. Tenderness to palpation over left trapezius muscle and in between shoulder blades. Cardiovascular: Normal rate, regular rhythm.  Good peripheral circulation. 2+ radial pulses. Respiratory: Normal respiratory effort without tachypnea or retractions. Lungs CTAB. Good air entry to the bases with no decreased or absent breath sounds. Musculoskeletal: Full range of motion to all extremities. No gross deformities appreciated. Neurologic:  Normal speech and language. No  gross focal neurologic deficits are appreciated.  Skin:  Skin is warm, dry and intact. No rash noted. Psychiatric: Mood and affect are normal. Speech and behavior are normal. Patient exhibits appropriate insight and judgement.   ____________________________________________   LABS (all labs ordered are listed, but only abnormal results are displayed)  Labs Reviewed - No data to display ____________________________________________  EKG   ____________________________________________  RADIOLOGY Lexine Baton, personally viewed and evaluated these images (plain radiographs) as part of my medical decision making, as well as reviewing the written report by the radiologist.    IMPRESSION:  Negative CT of the cervical spine. No explanation for the patient's  symptoms identified.      ____________________________________________    PROCEDURES  Procedure(s) performed:    Procedures    Medications  oxyCODONE-acetaminophen (PERCOCET/ROXICET) 5-325 MG per tablet 1 tablet (1 tablet Oral Given 03/25/17 1439)  ketorolac (TORADOL) 30 MG/ML injection 30 mg (30 mg Intramuscular Given 03/25/17 1514)  diazepam (VALIUM) tablet 5 mg (5 mg Oral Given 03/25/17 1514)     ____________________________________________   INITIAL IMPRESSION / ASSESSMENT AND PLAN / ED COURSE  Pertinent labs & imaging results that were available during my care of the patient were reviewed by me and considered in my medical decision making (see chart for details).  Review of the [place] CSRS was performed in accordance of the [facility] prior to dispensing any controlled drugs.    Patient presented to the emergency department for evaluation of neck pain. Vital signs and exam are reassuring. Patient had some tunnel vision this morning and some tingling in her left shoulder so CT neck was ordered. No acute abnormalities on CT. Patient improved with medication in ED. Symptoms are consistent with muscle spasm and a  pinched nerve. Patient will be discharged home with prescriptions for flexeril, prednisone, and a short course of tramadol. Patient is to follow up with PCP as directed. Patient is given ED precautions to return to the ED for any worsening or new symptoms.     ____________________________________________  FINAL CLINICAL IMPRESSION(S) / ED DIAGNOSES  Final diagnoses:  Pinched nerve in neck  Muscle spasm      NEW MEDICATIONS STARTED DURING THIS VISIT:  Discharge Medication List as of 03/25/2017  3:52 PM    START taking these medications   Details  cyclobenzaprine (FLEXERIL) 5 MG tablet Take 1 tablet (5 mg total) by mouth 3 (three) times daily as needed for muscle spasms., Starting Tue 03/25/2017, Until Tue 04/01/2017, Print    predniSONE (DELTASONE) 10 MG tablet Take 6 tablets on day 1, take 5 tablets on day 2, take 4 tablets on day 3, take 3 tablets on day 4, take 2 tablets on day 5, take 1 tablet on day 6, Print    traMADol (ULTRAM) 50 MG tablet Take 1  tablet (50 mg total) by mouth every 6 (six) hours as needed., Starting Tue 03/25/2017, Until Wed 03/25/2018, Print            This chart was dictated using voice recognition software/Dragon. Despite best efforts to proofread, errors can occur which can change the meaning. Any change was purely unintentional.    Enid Derry, PA-C 03/26/17 1644    Pershing Proud, Myra Rude, MD 03/26/17 [phone]

## 2017-03-25 NOTE — ED Notes (Signed)
Patient transported to CT 

## 2017-03-25 NOTE — ED Triage Notes (Signed)
Patient reports waking up this morning with neck pain. Denies injury/trauma. Hard for patient to turn head side to side

## 2017-06-10 ENCOUNTER — Inpatient Hospital Stay (INDEPENDENT_AMBULATORY_CARE_PROVIDER_SITE_OTHER): Payer: BC Managed Care – PPO

## 2017-06-10 ENCOUNTER — Ambulatory Visit (INDEPENDENT_AMBULATORY_CARE_PROVIDER_SITE_OTHER): Payer: BC Managed Care – PPO | Admitting: General Surgery

## 2017-06-10 ENCOUNTER — Encounter: Payer: Self-pay | Admitting: General Surgery

## 2017-06-10 VITALS — BP 116/74 | HR 96 | Resp 12 | Ht 71.0 in | Wt 197.0 lb

## 2017-06-10 DIAGNOSIS — E079 Disorder of thyroid, unspecified: Secondary | ICD-10-CM

## 2017-06-10 DIAGNOSIS — E041 Nontoxic single thyroid nodule: Secondary | ICD-10-CM

## 2017-06-10 NOTE — Progress Notes (Signed)
Patient ID: Sharon SartoriusJamie L Latendresse, female   DOB: 02/06/1981, 36 y.o.   MRN: [id number]  Chief Complaint  Patient presents with  . Follow-up    HPI Sharon SartoriusJamie L Folkert is a 36 y.o. female.  Here for follow up thyroid ultrasound. No new complaints.  She has 3 nodules in the right lobe of the thyroid which is relatively small and stable.  Prior attempted FNA showed insufficient cells. Patient also had a history of low thyroid and has been on 100 Mike grams of thyroid replacement.  A few months ago she had a T4 and TSH which were in acceptable levels.   HPI  Past Medical History:  Diagnosis Date  . Thyroid disease     Past Surgical History:  Procedure Laterality Date  . BREAST BIOPSY Left 05/02/2009  . CHOLECYSTECTOMY  2007    No family history on file.  Social History Social History   Tobacco Use  . Smoking status: Never Smoker  . Smokeless tobacco: Never Used  Substance Use Topics  . Alcohol use: Yes    Alcohol/week: 0.0 oz    Comment: occ  . Drug use: No    Allergies  Allergen Reactions  . Zithromax [Azithromycin] Swelling and Other (See Comments)    Water blister    Current Outpatient Medications  Medication Sig Dispense Refill  . levothyroxine (SYNTHROID, LEVOTHROID) 100 MCG tablet Take 1 tablet (100 mcg total) by mouth daily before breakfast. 90 tablet 4   No current facility-administered medications for this visit.     Review of Systems Review of Systems  Constitutional: Negative.   Respiratory: Negative.   Cardiovascular: Negative.     Blood pressure 116/74, pulse 96, resp. rate 12, height 5\' 11"  (1.803 m), weight 197 lb (89.4 kg).  Physical Exam Physical Exam  Constitutional: She is oriented to person, place, and time. She appears well-developed and well-nourished.  Eyes: Conjunctivae are normal. No scleral icterus.  Neck: Neck supple. No thyromegaly present.  Cardiovascular: Normal rate and regular rhythm.  Lymphadenopathy:    She has no cervical  adenopathy.  Neurological: She is alert and oriented to person, place, and time.  Skin: Skin is warm and dry.  Psychiatric: Her behavior is normal.  Thyroid ultrasound was repeated today.  In the mid to lower portion of the right lobe there are 3 nodules noted the largest is 0.75 cm and adjacent nodule is 0.68 cm and a third one closer to the inferior pole is 0.53 cm.  All 3 are benign-appearing and appear to be stable in size. Left lobe with no findings  Data Reviewed Prior notes reviewed   Assessment    Small thyroid nodules in the right lobe stable in size.  Patient has had history of low thyroid and has been on thyroid medicine off and on in the past currently she is continued on it with hope of suppressing the gland also.     Plan    Patient to follow with endocrinology and a referral was made for   assessment and subsequent follow-up   She is aware to obtain her mammogram at the age of 36 and yearly thereafter.  Recent mass in the right breast has resolved. Also recommended she obtain a primary care physician for general health assessment  The patient has been referred to [facility].  Documented by Sinda Duaryl-Lyn M Kennedy LPN  HPI, Physical Exam, Assessment and Plan have been scribed under the direction and in the presence of Kathreen CosierS. G. Trease Bremner, MD Mindi JunkerMarsha  Georgetta HaberHatch, RN I have completed the exam and reviewed the above documentation for accuracy and completeness.  I agree with the above.  [employer] has been used and any errors in dictation or transcription are unintentional.  Anida Deol G. Evette CristalSankar, M.D., F.A.C.S.   Gerlene BurdockSANKAR,Brent Taillon G 06/10/2017, 1:10 PM

## 2017-06-10 NOTE — Patient Instructions (Signed)
Referral to [facility].

## 2017-07-21 ENCOUNTER — Encounter: Payer: Self-pay | Admitting: Internal Medicine

## 2017-07-21 ENCOUNTER — Ambulatory Visit: Payer: BC Managed Care – PPO | Admitting: Internal Medicine

## 2017-07-21 VITALS — BP 108/76 | HR 88 | Temp 97.6°F | Resp 14 | Ht 71.0 in | Wt 201.4 lb

## 2017-07-21 DIAGNOSIS — E041 Nontoxic single thyroid nodule: Secondary | ICD-10-CM

## 2017-07-21 DIAGNOSIS — R635 Abnormal weight gain: Secondary | ICD-10-CM

## 2017-07-21 DIAGNOSIS — E039 Hypothyroidism, unspecified: Secondary | ICD-10-CM

## 2017-07-21 DIAGNOSIS — G8929 Other chronic pain: Secondary | ICD-10-CM

## 2017-07-21 DIAGNOSIS — M25512 Pain in left shoulder: Secondary | ICD-10-CM

## 2017-07-21 MED ORDER — CELECOXIB 200 MG PO CAPS: 200.0000 mg | ORAL_CAPSULE | Freq: Two times a day (BID) | ORAL | 0 refills | Status: DC

## 2017-07-21 MED ORDER — TRAMADOL HCL 50 MG PO TABS: 50.0000 mg | ORAL_TABLET | Freq: Three times a day (TID) | ORAL | 0 refills | Status: DC | PRN

## 2017-07-21 MED ORDER — OMEPRAZOLE 40 MG PO CPDR: 40.0000 mg | DELAYED_RELEASE_CAPSULE | Freq: Every day | ORAL | 3 refills | Status: DC

## 2017-07-21 NOTE — Progress Notes (Signed)
Subjective:  Patient ID: Sharon Weber Grob, female    DOB: 06/28/1981  Age: 37 y.o. MRN: [id number]  CC: The primary encounter diagnosis was Weight gain. Diagnoses of Thyroid nodule, Chronic left shoulder pain, and Acquired hypothyroidism were also pertinent to this visit.  HPI Sharon Weber Duhamel presents for etablishment of care . She is referred by Dr. Evette CristalSankar.   1) unwelcome  weight gain. 27 lbs since April.   Diet and activity reviewed.  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  Gets only one 30 minute break for lunch. Skips breakfast due to intolerance of anything but  water in the morning .  Has a Snack at work,   No fast food. Eats a normal dinner once home.  Not exercising regularly even on days off .       2) History of hypothyroidism and thyroid nodules that have been followed serially with Utrasounds showing gradual enlargement.  Last US was Dec 2018; 3 in the right, small and stable per Dr Evette CristalSankar.   Report not available. Dr Evette CristalSankar attempted an FNA in the past , but the results were  Inconclusive.  Had one meeting with Dr Tedd SiasSolum  , did not "hit it off."  Was rushed through,  No discussion .   Dr Evette CristalSankar has referred her [facility] .   3) Left shoulder pain for over a year,  Getting worse, now has numbness and tingling to hand with decreased grip when she abducts or flexes arm.  Interfering with duties as OR nurse .  had a CT neck,  No cervical or foraminal stenosis.   Saw Poggi,  X ray normal.  No MRI of shoulder done.  Received a total of 4 steroid injections,  No improvement.  Had  PT for shoulder without improvement despite  doing exercises.  Now   Seeing chiropractor, I who is trying kinetic taper across her upper back . Inconstant pain.  using motrin 800 mg tid sometimes 4  Times daily . not taking a PPI>   History Asher MuirJamie has a past medical history of Thyroid disease.   She has a past surgical history that includes Cholecystectomy (2007) and Breast biopsy (Left,  05/02/2009).   Her family history includes Alcohol abuse in her paternal grandfather; Arthritis in her mother; Asthma in her mother; COPD in her mother; Cancer in her maternal grandfather and paternal grandmother; Diabetes in her father, paternal grandfather, and sister; Early death in her maternal grandmother and paternal grandfather; Heart attack in her maternal grandmother and paternal grandfather; Hyperlipidemia in her father; Hypertension in her father; Learning disabilities in her sister; Miscarriages / Stillbirths in her mother; Stroke in her mother.She reports that  has never smoked. she has never used smokeless tobacco. She reports that she drinks alcohol. She reports that she does not use drugs.  Outpatient Medications Prior to Visit  Medication Sig Dispense Refill  . cetirizine (ZYRTEC) 10 MG tablet Take 10 mg by mouth daily.    Marland Kitchen. ibuprofen (ADVIL,MOTRIN) 800 MG tablet Take 800 mg by mouth 3 (three) times daily.    Marland Kitchen. levothyroxine (SYNTHROID, LEVOTHROID) 100 MCG tablet Take 1 tablet (100 mcg total) by mouth daily before breakfast. 90 tablet 4   No facility-administered medications prior to visit.     Review of Systems:  Patient denies headache, fevers, malaise, unintentional weight loss, skin rash, eye pain, sinus congestion and sinus pain, sore throat, dysphagia,  hemoptysis , cough, dyspnea, wheezing, chest  pain, palpitations, orthopnea, edema, abdominal pain, nausea, melena, diarrhea, constipation, flank pain, dysuria, hematuria, urinary  Frequency, nocturia, numbness, tingling, seizures,  Focal weakness, Loss of consciousness,  Tremor, insomnia, depression, anxiety, and suicidal ideation.     Objective:  BP 108/76 (BP Location: Left Arm, Patient Position: Sitting, Cuff Size: Normal)   Pulse 88   Temp 97.6 F (36.4 C) (Oral)   Resp 14   Ht 5\' 11"  (1.803 m)   Wt 201 lb 6.4 oz (91.4 kg)   SpO2 97%   BMI 28.09 kg/m   Physical Exam:   Assessment & Plan:   Problem List  Items Addressed This Visit    Hypothyroidism    Managed with levothyroxine.  No changes to regimen  today  Lab Results  Component Value Date   TSH 1.270 03/10/2017        Left shoulder pain    Present for one year with no improvement despite evaluation by Poggi ,  PT and steroid injections.  MRI shoulder warranted given negative CT spine and progression of disabling pain to now include radiculopathy  And weakness of hand grip.  Tramadol for severe pain,  Reduce NSAID use to celebrex 200mg  bid,  Add PPI       Relevant Orders   MR Shoulder Left Wo Contrast   Thyroid nodule    Referral to Endocrinology is in progress for management and repeat FNA if indicated       Relevant Orders   Ambulatory referral to Endocrinology   Weight gain - Primary    No evidence of under treated thyroid.  Addressed eating habits and lack of regular exercise. Will reassess in 3 months and consider pharmacotherapy if she has been unable to lose 12 lbs by then.       Relevant Orders   Comprehensive metabolic panel   Lipid panel      I am having Milcah Weber. Germain start on omeprazole, celecoxib, and traMADol. I am also having her maintain her levothyroxine, cetirizine, and ibuprofen.  Meds ordered this encounter  Medications  . omeprazole (PRILOSEC) 40 MG capsule    Sig: Take 1 capsule (40 mg total) by mouth daily.    Dispense:  30 capsule    Refill:  3  . celecoxib (CELEBREX) 200 MG capsule    Sig: Take 1 capsule (200 mg total) by mouth 2 (two) times daily.    Dispense:  60 capsule    Refill:  0  . traMADol (ULTRAM) 50 MG tablet    Sig: Take 1 tablet (50 mg total) by mouth every 8 (eight) hours as needed.    Dispense:  90 tablet    Refill:  0    There are no discontinued medications.  Follow-up: No Follow-up on file.   Sherlene Shams, MD

## 2017-07-21 NOTE — Patient Instructions (Signed)
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  Follow up in 3 months  Fasting labs at your convenience   There are low carb "on the go" breakfast drinks and bars:  1) try the premixed protein drinks (Atkins, AdvantEdge and the best tasting , highest protein one available is called " Premier Protein"; it is advocated by the bariatric surgeons for their patients and  available of < $2 serving at [facility] and  In bulk for $1.50/serving at [employer] and [employer]  .    Nutritional analysis :  160 cal  30 g protein  1 g sugar 50% calcium needs   Nicolette BangWal Mart and [employer]J's   2) There are plenty of high protein  HIGH  carb cookies,  But only the following are low carb."  Look for them in the diet section of most grocery stores or vitamin shops,  where the protein shakes are  sold.   All of these have 5 g sugar varieties if you read the label  : Power crunch Atkins bars KIND : make sure you find the  "low glycemic index"  variety QUEST : (taste better after being microwaved for 5 sec OUT OF THE WRAPPER)

## 2017-07-22 ENCOUNTER — Encounter: Payer: Self-pay | Admitting: Internal Medicine

## 2017-07-22 DIAGNOSIS — E041 Nontoxic single thyroid nodule: Secondary | ICD-10-CM | POA: Insufficient documentation

## 2017-07-22 DIAGNOSIS — M25512 Pain in left shoulder: Secondary | ICD-10-CM | POA: Insufficient documentation

## 2017-07-22 DIAGNOSIS — E039 Hypothyroidism, unspecified: Secondary | ICD-10-CM | POA: Insufficient documentation

## 2017-07-22 DIAGNOSIS — R635 Abnormal weight gain: Secondary | ICD-10-CM | POA: Insufficient documentation

## 2017-07-22 NOTE — Assessment & Plan Note (Signed)
Managed with levothyroxine.  No changes to regimen  today  Lab Results  Component Value Date   TSH 1.270 03/10/2017

## 2017-07-22 NOTE — Assessment & Plan Note (Signed)
Present for one year with no improvement despite evaluation by Poggi ,  PT and steroid injections.  MRI shoulder warranted given negative CT spine and progression of disabling pain to now include radiculopathy  And weakness of hand grip.  Tramadol for severe pain,  Reduce NSAID use to celebrex 200mg  bid,  Add PPI

## 2017-07-22 NOTE — Assessment & Plan Note (Signed)
No evidence of under treated thyroid.  Addressed eating habits and lack of regular exercise. Will reassess in 3 months and consider pharmacotherapy if she has been unable to lose 12 lbs by then.

## 2017-07-22 NOTE — Assessment & Plan Note (Signed)
Referral to Endocrinology is in progress for management and repeat FNA if indicated

## 2017-07-23 ENCOUNTER — Telehealth: Payer: Self-pay

## 2017-07-23 NOTE — Telephone Encounter (Signed)
Please advise 

## 2017-07-23 NOTE — Telephone Encounter (Signed)
Melissa: have you heard anything about the MRI of shoulder for this pt?   Dr. Darrick Huntsmanullo would  You like for me to type up this letter stating that pt should not be doing any heavy lifting until further notice?

## 2017-07-23 NOTE — Telephone Encounter (Signed)
Pt has been notified that the letter has been completed and may be picked up. Pt stated that she would send her son or daughter by to pick it up. Pt is also aware of the appt date, time and place for her MRI. Pt gave a verbal understanding.

## 2017-07-23 NOTE — Telephone Encounter (Signed)
Yes, she has been schedule for MRI on Tuesday 1/29. I left her a message with appt details.

## 2017-07-23 NOTE — Telephone Encounter (Signed)
Letter written

## 2017-07-23 NOTE — Telephone Encounter (Signed)
Copied from CRM [phone]. Topic: General - Other >> Jul 23, 2017  1:27 PM Percival SpanishKennedy, Cheryl W wrote:  Pt call to follow up on the MRI that she is to be scheduled for. She also was told no heavy lifting and her job is requiring her to have a note stating that she is to not do heavy lifting   [phone]  can leave a detailed message if pt doesn't answer

## 2017-07-29 ENCOUNTER — Ambulatory Visit
Admission: RE | Admit: 2017-07-29 | Discharge: 2017-07-29 | Disposition: A | Discharge status: Home / Self Care | Payer: BC Managed Care – PPO | Source: Ambulatory Visit | Attending: Internal Medicine | Admitting: Internal Medicine

## 2017-07-29 DIAGNOSIS — M25512 Pain in left shoulder: Secondary | ICD-10-CM

## 2017-07-29 DIAGNOSIS — G8929 Other chronic pain: Secondary | ICD-10-CM | POA: Diagnosis present

## 2017-07-29 DIAGNOSIS — S43492A Other sprain of left shoulder joint, initial encounter: Secondary | ICD-10-CM | POA: Diagnosis not present

## 2017-07-29 DIAGNOSIS — X58XXXA Exposure to other specified factors, initial encounter: Secondary | ICD-10-CM | POA: Diagnosis not present

## 2017-07-30 ENCOUNTER — Encounter: Payer: Self-pay | Admitting: Internal Medicine

## 2017-08-27 ENCOUNTER — Telehealth: Payer: Self-pay | Admitting: Internal Medicine

## 2017-08-27 DIAGNOSIS — M4712 Other spondylosis with myelopathy, cervical region: Secondary | ICD-10-CM

## 2017-08-27 DIAGNOSIS — M4722 Other spondylosis with radiculopathy, cervical region: Principal | ICD-10-CM

## 2017-08-27 NOTE — Telephone Encounter (Signed)
Please advise 

## 2017-08-27 NOTE — Telephone Encounter (Signed)
Copied from CRM [id number]. Topic: Referral - Request >> Aug 27, 2017 11:29 AM Louie BunPalacios Medina, Rosey Batheresa D wrote: Reason for CRM: Patient needs a referral to see neurosurgeon per her MRI she had. Please call patient back, thanks.

## 2017-08-27 NOTE — Telephone Encounter (Signed)
[facility] and an MRI was done of neck and they wanted to refer patient to an orthopaedic spine surgeon , patient does not want this she would like to be referred to Dr Albertine PatriciaPeter Grossi [facility] . [place] advised was not patient shoulder causing her problem was to do with the spine ask patient to see if could attain copy of MRI.

## 2017-09-01 NOTE — Telephone Encounter (Signed)
Referral made 

## 2017-09-01 NOTE — Telephone Encounter (Signed)
Patient notified and voiced understanding.

## 2017-09-11 ENCOUNTER — Other Ambulatory Visit: Payer: Self-pay | Admitting: General Surgery

## 2017-09-11 MED ORDER — OSELTAMIVIR PHOSPHATE 75 MG PO CAPS: 75.0000 mg | ORAL_CAPSULE | Freq: Two times a day (BID) | ORAL | 0 refills | Status: DC

## 2017-09-11 NOTE — Progress Notes (Signed)
Son with flu, patient now with similar symptoms. Scheduled for neck surgery next week. Will RX w/ Tamiflu.

## 2018-03-23 ENCOUNTER — Telehealth: Payer: Self-pay | Admitting: Internal Medicine

## 2018-03-23 MED ORDER — PREDNISONE 10 MG PO TABS: ORAL_TABLET | ORAL | 0 refills | Status: DC

## 2018-03-23 MED ORDER — TRIAMCINOLONE ACETONIDE 0.1 % EX CREA: 1.0000 "application " | TOPICAL_CREAM | Freq: Two times a day (BID) | CUTANEOUS | 0 refills | Status: DC

## 2018-03-23 NOTE — Telephone Encounter (Signed)
Copied from CRM [id number]. Topic: Quick Communication - Rx Refill/Question >> Mar 23, 2018  8:42 AM Maia Pettiesrtiz, Kristie S wrote: Medication: prednisone 10mg  "take as directed" - pt came in to pharmacy wanting a refill due to bad case of poison ivy - pt had an ongoing RX from a previous physician - please advise.  Preferred Pharmacy (with phone number or street name): [facility] [id number] [facility] [place], [place] - [address] [phone] (Phone) [phone] (Fax)

## 2018-03-23 NOTE — Telephone Encounter (Signed)
[facility] [address] You do not need to be seen unless it does not get better.  I would also use topical  Triamcinolone for the itching,  So I have sent that as well

## 2018-03-23 NOTE — Telephone Encounter (Signed)
Pt states that she has a bad case of poison ivy and is wanting to know if she can get a refill on prednisone 10mg  or does pt need to be seen?

## 2018-04-13 ENCOUNTER — Other Ambulatory Visit: Payer: Self-pay

## 2018-04-14 ENCOUNTER — Telehealth: Payer: Self-pay | Admitting: *Deleted

## 2018-04-14 ENCOUNTER — Other Ambulatory Visit: Payer: Self-pay | Admitting: General Surgery

## 2018-04-14 ENCOUNTER — Encounter: Payer: Self-pay | Admitting: *Deleted

## 2018-04-14 DIAGNOSIS — L237 Allergic contact dermatitis due to plants, except food: Secondary | ICD-10-CM

## 2018-04-14 MED ORDER — PREDNISONE 10 MG PO TABS: ORAL_TABLET | ORAL | 0 refills | Status: DC

## 2018-04-14 NOTE — Telephone Encounter (Signed)
Dr Amalia Hailey did labs at [facility]. Will discuss with Dr Lemar Livings.

## 2018-04-14 NOTE — Progress Notes (Signed)
Exposure to poison oak with resulting rash. Completed six day taper with transient relief. With past episodes, she reports better response with 12 day course.  Present meds; Synthroid 100 mcg/ day.

## 2018-04-14 NOTE — Telephone Encounter (Signed)
Called to ask if we were to continuing to fill levothyroxine or if [facility] was managing it now? When was most recent thyroid labs?

## 2018-04-15 NOTE — Telephone Encounter (Signed)
Dr Lemar Livings talked with the patient .

## 2018-04-15 NOTE — Telephone Encounter (Signed)
She will obtain refills from her Endocrinologist.

## 2018-04-17 MED ORDER — PREDNISONE 10 MG PO TABS: ORAL_TABLET | ORAL | 0 refills | Status: DC

## 2018-04-17 MED ORDER — TRIAMCINOLONE ACETONIDE 0.1 % EX CREA: 1.0000 "application " | TOPICAL_CREAM | Freq: Two times a day (BID) | CUTANEOUS | 0 refills | Status: DC

## 2018-06-16 ENCOUNTER — Other Ambulatory Visit: Payer: Self-pay | Admitting: General Surgery

## 2018-12-30 ENCOUNTER — Telehealth: Payer: Self-pay | Admitting: *Deleted

## 2018-12-30 NOTE — Telephone Encounter (Signed)
Copied from [place] [id number]. Topic: Appointment Scheduling - Scheduling Inquiry for Clinic >> Dec 29, 2018  4:24 PM Berneta Levins wrote: Reason for CRM:   Pt calling requesting to schedule a physical.

## 2018-12-31 ENCOUNTER — Other Ambulatory Visit: Payer: Self-pay

## 2019-01-11 ENCOUNTER — Ambulatory Visit (INDEPENDENT_AMBULATORY_CARE_PROVIDER_SITE_OTHER): Payer: 59 | Admitting: Internal Medicine

## 2019-01-11 ENCOUNTER — Encounter: Payer: Self-pay | Admitting: Internal Medicine

## 2019-01-11 ENCOUNTER — Other Ambulatory Visit: Payer: Self-pay

## 2019-01-11 VITALS — BP 118/70 | HR 83 | Temp 97.7°F | Resp 14 | Ht 71.0 in | Wt 199.1 lb

## 2019-01-11 DIAGNOSIS — Z1239 Encounter for other screening for malignant neoplasm of breast: Secondary | ICD-10-CM | POA: Diagnosis not present

## 2019-01-11 DIAGNOSIS — R635 Abnormal weight gain: Secondary | ICD-10-CM | POA: Diagnosis not present

## 2019-01-11 DIAGNOSIS — Z0000 Encounter for general adult medical examination without abnormal findings: Secondary | ICD-10-CM | POA: Diagnosis not present

## 2019-01-11 DIAGNOSIS — Z114 Encounter for screening for human immunodeficiency virus [HIV]: Secondary | ICD-10-CM

## 2019-01-11 NOTE — Progress Notes (Signed)
Patient ID: Sharon Weber, female    DOB: 01/12/1981  Age: 38 y.o. MRN: [id number]  The patient is here for annual  wellness examination and management of other chronic and acute problems.   The risk factors are reflected in the social history.  The roster of all physicians providing medical care to patient - is listed in the Snapshot section of the chart.  Activities of daily living:  The patient is 100% independent in all ADLs: dressing, toileting, feeding as well as independent mobility  Home safety : The patient has smoke detectors in the home. They wear seatbelts.  There are no firearms at home. There is no violence in the home.   There is no risks for hepatitis, STDs or HIV. There is no   history of blood transfusion. They have no travel history to infectious disease endemic areas of the world.  The patient has seen their dentist in the last six month. They have seen their eye doctor in the last year. They deny  hearing difficulty with regard to whispered voices and some television programs.   They do not  have excessive sun exposure. Discussed the need for sun protection: hats, long sleeves and use of sunscreen if there is significant sun exposure.   Diet: the importance of a healthy diet is discussed. They do have a healthy diet.  The benefits of regular aerobic exercise were discussed. She walks 4 times per week ,  20 minutes.   Depression screen: there are no signs or vegative symptoms of depression- irritability, change in appetite, anhedonia, sadness/tearfullness.  Cognitive assessment: the patient manages all their financial and personal affairs and is actively engaged. They could relate day,date,year and events; recalled 2/3 objects at 3 minutes; performed clock-face test normally.  The following portions of the patient's history were reviewed and updated as appropriate: allergies, current medications, past family history, past medical history,  past surgical history, past social  history  and problem list.  Visual acuity was not assessed per patient preference since she has regular follow up with her ophthalmologist. Hearing and body mass index were assessed and reviewed.   During the course of the visit the patient was educated and counseled about appropriate screening and preventive services including : fall prevention , diabetes screening, nutrition counseling, colorectal cancer screening, and recommended immunizations.    CC: The primary encounter diagnosis was Weight gain. Diagnoses of Screening for HIV (human immunodeficiency virus), Breast cancer screening, and Encounter for preventive health examination were also pertinent to this visit.  Diagnosed with hashimoto's ,  Had a negative biopsy (nodule had triplesd in size,  Then disappeared after prayer)    History Sharon Weber has a past medical history of Thyroid disease.   She has a past surgical history that includes Cholecystectomy (2007) and Breast biopsy (Left, 05/02/2009).   Her family history includes Alcohol abuse in her paternal grandfather; Arthritis in her mother; Asthma in her mother; COPD in her mother; Cancer in her maternal grandfather and paternal grandmother; Diabetes in her father, paternal grandfather, and sister; Early death in her maternal grandmother and paternal grandfather; Heart attack in her maternal grandmother and paternal grandfather; Hyperlipidemia in her father; Hypertension in her father; Learning disabilities in her sister; Miscarriages / Stillbirths in her mother; Stroke in her mother.She reports that she has never smoked. She has never used smokeless tobacco. She reports current alcohol use. She reports that she does not use drugs.  Outpatient Medications Prior to Visit  Medication Sig Dispense  Refill  . cetirizine (ZYRTEC) 10 MG tablet Take 10 mg by mouth daily.    Marland Kitchen. ibuprofen (ADVIL,MOTRIN) 800 MG tablet Take 800 mg by mouth 3 (three) times daily.    Marland Kitchen. levothyroxine (SYNTHROID,  LEVOTHROID) 100 MCG tablet Take 1 tablet (100 mcg total) by mouth daily before breakfast. 90 tablet 4  . celecoxib (CELEBREX) 200 MG capsule Take 1 capsule (200 mg total) by mouth 2 (two) times daily. (Patient not taking: Reported on 01/11/2019) 60 capsule 0  . omeprazole (PRILOSEC) 40 MG capsule Take 1 capsule (40 mg total) by mouth daily. (Patient not taking: Reported on 01/11/2019) 30 capsule 3  . oseltamivir (TAMIFLU) 75 MG capsule Take 1 capsule (75 mg total) by mouth 2 (two) times daily. (Patient not taking: Reported on 01/11/2019) 10 capsule 0  . predniSONE (DELTASONE) 10 MG tablet 6 tablets on Day 1 , then reduce by 1 tablet daily until gone (Patient not taking: Reported on 01/11/2019) 21 tablet 0  . predniSONE (DELTASONE) 10 MG tablet Take six tablets today, decrease by one tablet every other day until finished. (Patient not taking: Reported on 01/11/2019) 42 tablet 0  . traMADol (ULTRAM) 50 MG tablet Take 1 tablet (50 mg total) by mouth every 8 (eight) hours as needed. (Patient not taking: Reported on 01/11/2019) 90 tablet 0  . triamcinolone cream (KENALOG) 0.1 % Apply 1 application topically 2 (two) times daily. (Patient not taking: Reported on 01/11/2019) 30 g 0   No facility-administered medications prior to visit.     Review of Systems   Patient denies headache, fevers, malaise, unintentional weight loss, skin rash, eye pain, sinus congestion and sinus pain, sore throat, dysphagia,  hemoptysis , cough, dyspnea, wheezing, chest pain, palpitations, orthopnea, edema, abdominal pain, nausea, melena, diarrhea, constipation, flank pain, dysuria, hematuria, urinary  Frequency, nocturia, numbness, tingling, seizures,  Focal weakness, Loss of consciousness,  Tremor, insomnia, depression, anxiety, and suicidal ideation.      Objective:  BP 118/70 (BP Location: Left Arm, Patient Position: Sitting, Cuff Size: Normal)   Pulse 83   Temp 97.7 F (36.5 C) (Oral)   Resp 14   Ht 5\' 11"  (1.803 m)   Wt  199 lb 1.9 oz (90.3 kg)   SpO2 99%   BMI 27.77 kg/m   Physical Exam   General appearance: alert, cooperative and appears stated age Head: Normocephalic, without obvious abnormality, atraumatic Eyes: conjunctivae/corneas clear. PERRL, EOM's intact. Fundi benign. Ears: normal TM's and external ear canals both ears Nose: Nares normal. Septum midline. Mucosa normal. No drainage or sinus tenderness. Throat: lips, mucosa, and tongue normal; teeth and gums normal Neck: no adenopathy, no carotid bruit, no JVD, supple, symmetrical, trachea midline and thyroid not enlarged, symmetric, no tenderness/mass/nodules Lungs: clear to auscultation bilaterally Breasts: normal appearance, no masses or tenderness Heart: regular rate and rhythm, S1, S2 normal, no murmur, click, rub or gallop Abdomen: soft, non-tender; bowel sounds normal; no masses,  no organomegaly Extremities: extremities normal, atraumatic, no cyanosis or edema Pulses: 2+ and symmetric Skin: Skin color, texture, turgor normal. No rashes or lesions Neurologic: Alert and oriented X 3, normal strength and tone. Normal symmetric reflexes. Normal coordination and gait.      Assessment & Plan:   Problem List Items Addressed This Visit      Unprioritized   Weight gain - Primary   Relevant Orders   Comprehensive metabolic panel (Completed)   Encounter for preventive health examination    age appropriate education and counseling updated, referrals  for preventative services and immunizations addressed, dietary and smoking counseling addressed, most recent labs reviewed.  I have personally reviewed and have noted:  1) the patient's medical and social history 2) The pt's use of alcohol, tobacco, and illicit drugs 3) The patient's current medications and supplements 4) Functional ability including ADL's, fall risk, home safety risk, hearing and visual impairment 5) Diet and physical activities 6) Evidence for depression or mood  disorder 7) The patient's height, weight, and BMI have been recorded in the chart  I have made referrals, and provided counseling and education based on review of the above       Other Visit Diagnoses    Screening for HIV (human immunodeficiency virus)       Relevant Orders   HIV Antibody (routine testing w rflx) (Completed)   Breast cancer screening       Relevant Orders   MM 3D SCREEN BREAST BILATERAL      I have discontinued Jeanifer L. Damewood's omeprazole, celecoxib, traMADol, oseltamivir, predniSONE, triamcinolone cream, and predniSONE. I am also having her maintain her levothyroxine, cetirizine, and ibuprofen.  No orders of the defined types were placed in this encounter.   Medications Discontinued During This Encounter  Medication Reason  . celecoxib (CELEBREX) 200 MG capsule Error  . omeprazole (PRILOSEC) 40 MG capsule Error  . oseltamivir (TAMIFLU) 75 MG capsule Error  . predniSONE (DELTASONE) 10 MG tablet Error  . predniSONE (DELTASONE) 10 MG tablet Error  . traMADol (ULTRAM) 50 MG tablet Error  . triamcinolone cream (KENALOG) 0.1 % Error    Follow-up: No follow-ups on file.   Crecencio Mc, MD

## 2019-01-11 NOTE — Patient Instructions (Signed)
Health Maintenance, Female Adopting a healthy lifestyle and getting preventive care are important in promoting health and wellness. Ask your health care provider about:  The right schedule for you to have regular tests and exams.  Things you can do on your own to prevent diseases and keep yourself healthy. What should I know about diet, weight, and exercise? Eat a healthy diet   Eat a diet that includes plenty of vegetables, fruits, low-fat dairy products, and lean protein.  Do not eat a lot of foods that are high in solid fats, added sugars, or sodium. Maintain a healthy weight Body mass index (BMI) is used to identify weight problems. It estimates body fat based on height and weight. Your health care provider can help determine your BMI and help you achieve or maintain a healthy weight. Get regular exercise Get regular exercise. This is one of the most important things you can do for your health. Most adults should:  Exercise for at least 150 minutes each week. The exercise should increase your heart rate and make you sweat (moderate-intensity exercise).  Do strengthening exercises at least twice a week. This is in addition to the moderate-intensity exercise.  Spend less time sitting. Even light physical activity can be beneficial. Watch cholesterol and blood lipids Have your blood tested for lipids and cholesterol at 37 years of age, then have this test every 5 years. Have your cholesterol levels checked more often if:  Your lipid or cholesterol levels are high.  You are older than 37 years of age.  You are at high risk for heart disease. What should I know about cancer screening? Depending on your health history and family history, you may need to have cancer screening at various ages. This may include screening for:  Breast cancer.  Cervical cancer.  Colorectal cancer.  Skin cancer.  Lung cancer. What should I know about heart disease, diabetes, and high blood  pressure? Blood pressure and heart disease  High blood pressure causes heart disease and increases the risk of stroke. This is more likely to develop in people who have high blood pressure readings, are of African descent, or are overweight.  Have your blood pressure checked: ? Every 3-5 years if you are 18-39 years of age. ? Every year if you are 40 years old or older. Diabetes Have regular diabetes screenings. This checks your fasting blood sugar level. Have the screening done:  Once every three years after age 40 if you are at a normal weight and have a low risk for diabetes.  More often and at a younger age if you are overweight or have a high risk for diabetes. What should I know about preventing infection? Hepatitis B If you have a higher risk for hepatitis B, you should be screened for this virus. Talk with your health care provider to find out if you are at risk for hepatitis B infection. Hepatitis C Testing is recommended for:  Everyone born from 1945 through 1965.  Anyone with known risk factors for hepatitis C. Sexually transmitted infections (STIs)  Get screened for STIs, including gonorrhea and chlamydia, if: ? You are sexually active and are younger than 37 years of age. ? You are older than 37 years of age and your health care provider tells you that you are at risk for this type of infection. ? Your sexual activity has changed since you were last screened, and you are at increased risk for chlamydia or gonorrhea. Ask your health care provider if   you are at risk.  Ask your health care provider about whether you are at high risk for HIV. Your health care provider may recommend a prescription medicine to help prevent HIV infection. If you choose to take medicine to prevent HIV, you should first get tested for HIV. You should then be tested every 3 months for as long as you are taking the medicine. Pregnancy  If you are about to stop having your period (premenopausal) and  you may become pregnant, seek counseling before you get pregnant.  Take 400 to 800 micrograms (mcg) of folic acid every day if you become pregnant.  Ask for birth control (contraception) if you want to prevent pregnancy. Osteoporosis and menopause Osteoporosis is a disease in which the bones lose minerals and strength with aging. This can result in bone fractures. If you are 65 years old or older, or if you are at risk for osteoporosis and fractures, ask your health care provider if you should:  Be screened for bone loss.  Take a calcium or vitamin D supplement to lower your risk of fractures.  Be given hormone replacement therapy (HRT) to treat symptoms of menopause. Follow these instructions at home: Lifestyle  Do not use any products that contain nicotine or tobacco, such as cigarettes, e-cigarettes, and chewing tobacco. If you need help quitting, ask your health care provider.  Do not use street drugs.  Do not share needles.  Ask your health care provider for help if you need support or information about quitting drugs. Alcohol use  Do not drink alcohol if: ? Your health care provider tells you not to drink. ? You are pregnant, may be pregnant, or are planning to become pregnant.  If you drink alcohol: ? Limit how much you use to 0-1 drink a day. ? Limit intake if you are breastfeeding.  Be aware of how much alcohol is in your drink. In the [place] one drink equals one 12 oz bottle of beer (355 mL), one 5 oz glass of wine (148 mL), or one 1 oz glass of hard liquor (44 mL). General instructions  Schedule regular health, dental, and eye exams.  Stay current with your vaccines.  Tell your health care provider if: ? You often feel depressed. ? You have ever been abused or do not feel safe at home. Summary  Adopting a healthy lifestyle and getting preventive care are important in promoting health and wellness.  Follow your health care provider's instructions about healthy  diet, exercising, and getting tested or screened for diseases.  Follow your health care provider's instructions on monitoring your cholesterol and blood pressure. This information is not intended to replace advice given to you by your health care provider. Make sure you discuss any questions you have with your health care provider. Document Released: 12/31/2010 Document Revised: 06/10/2018 Document Reviewed: 06/10/2018 Elsevier Patient Education  2020 [employer] Inc.  

## 2019-01-12 DIAGNOSIS — Z0000 Encounter for general adult medical examination without abnormal findings: Secondary | ICD-10-CM | POA: Insufficient documentation

## 2019-01-12 DIAGNOSIS — Z0001 Encounter for general adult medical examination with abnormal findings: Secondary | ICD-10-CM | POA: Insufficient documentation

## 2019-01-12 LAB — COMPREHENSIVE METABOLIC PANEL
ALT: 14 U/L (ref 0–35)
AST: 12 U/L (ref 0–37)
Albumin: 4.6 g/dL (ref 3.5–5.2)
Alkaline Phosphatase: 52 U/L (ref 39–117)
BUN: 15 mg/dL (ref 6–23)
CO2: 27 mEq/L (ref 19–32)
Calcium: 9.2 mg/dL (ref 8.4–10.5)
Chloride: 101 mEq/L (ref 96–112)
Creatinine, Ser: 0.97 mg/dL (ref 0.40–1.20)
GFR: 64.64 mL/min (ref >60.00)
Glucose, Bld: 104 mg/dL — ABNORMAL HIGH (ref 70–99)
Potassium: 3.9 mEq/L (ref 3.5–5.1)
Sodium: 137 mEq/L (ref 135–145)
Total Bilirubin: 0.4 mg/dL (ref 0.2–1.2)
Total Protein: 7 g/dL (ref 6.0–8.3)

## 2019-01-12 LAB — HIV ANTIBODY (ROUTINE TESTING W REFLEX): HIV 1&2 Ab, 4th Generation: NONREACTIVE

## 2019-01-12 NOTE — Assessment & Plan Note (Signed)

## 2019-01-27 ENCOUNTER — Encounter: Payer: Self-pay | Admitting: Internal Medicine

## 2019-02-02 IMAGING — CT CT CERVICAL SPINE W/O CM
3 of 4 series · 11 of 33 positions shown, 13 images · non-contrast
Comparison: None.

CLINICAL DATA: Neck and left shoulder pain for months. Patient
awoke today with a crick in the neck and has severe pain. No acute
injury.

EXAM:
CT CERVICAL SPINE WITHOUT CONTRAST
TECHNIQUE: Multidetector CT imaging of the cervical spine was performed without
intravenous contrast. Multiplanar CT image reconstructions were also
generated.

[Series 6: sagittal bone · sagittal · 0.22mm/px · 5 of 50 slices shown, 6 images]
[im 17/50  bone]
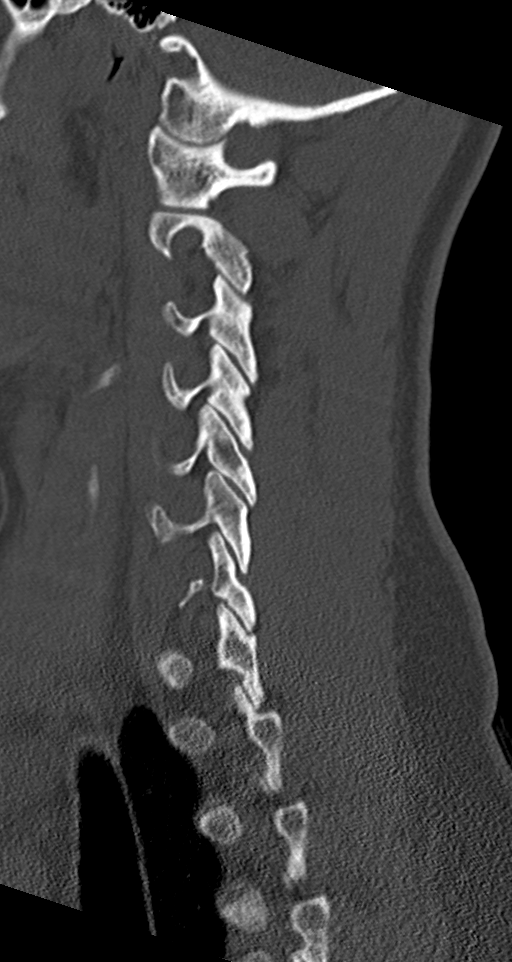
[im 21/50  bone]
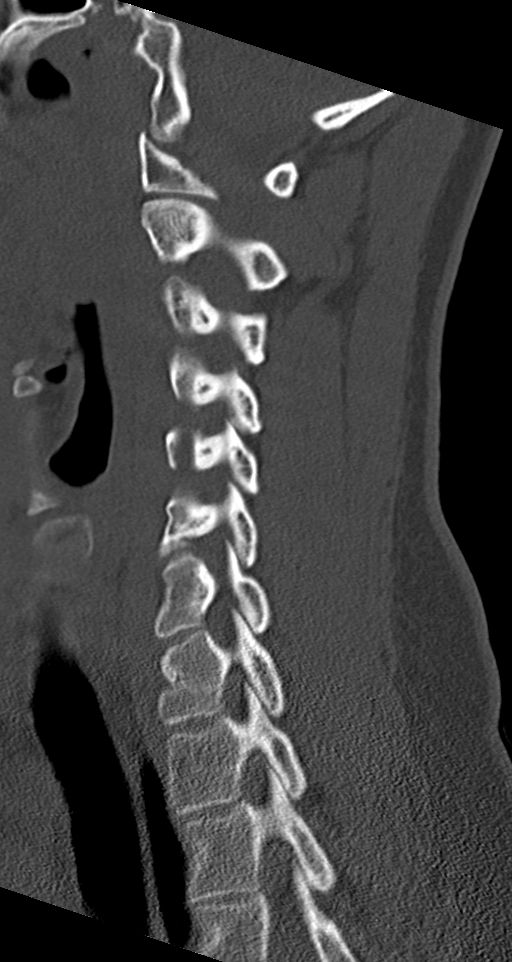
[im 25/50  soft-tissue]
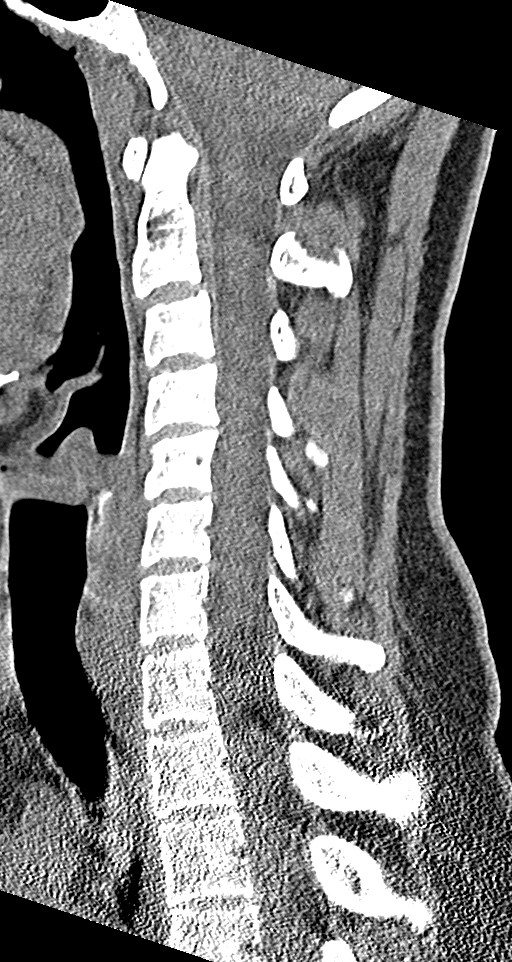
[im 25/50  bone]
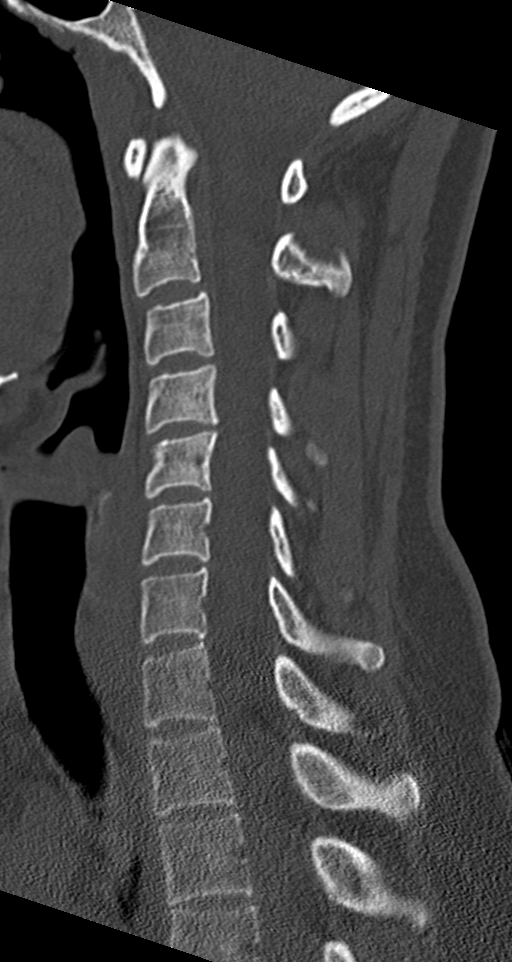
[im 29/50  bone]
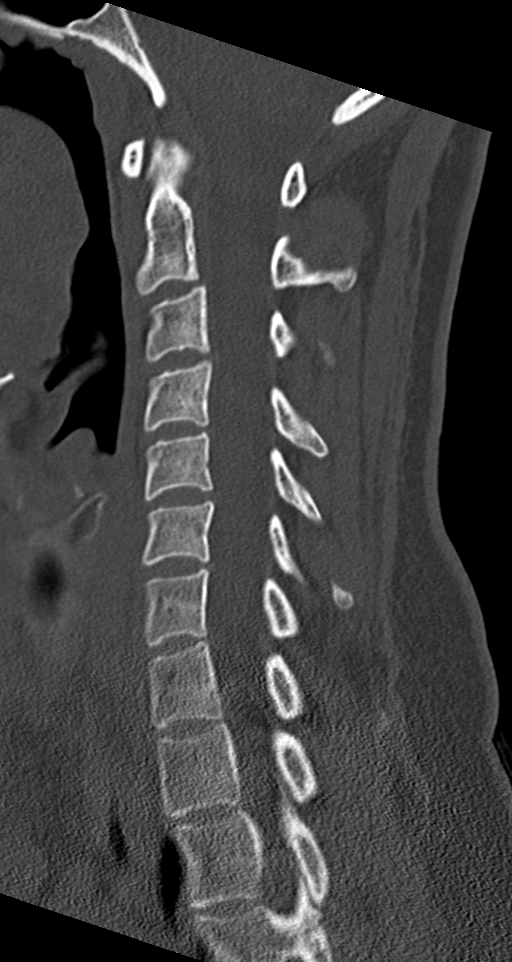
[im 33/50  bone]
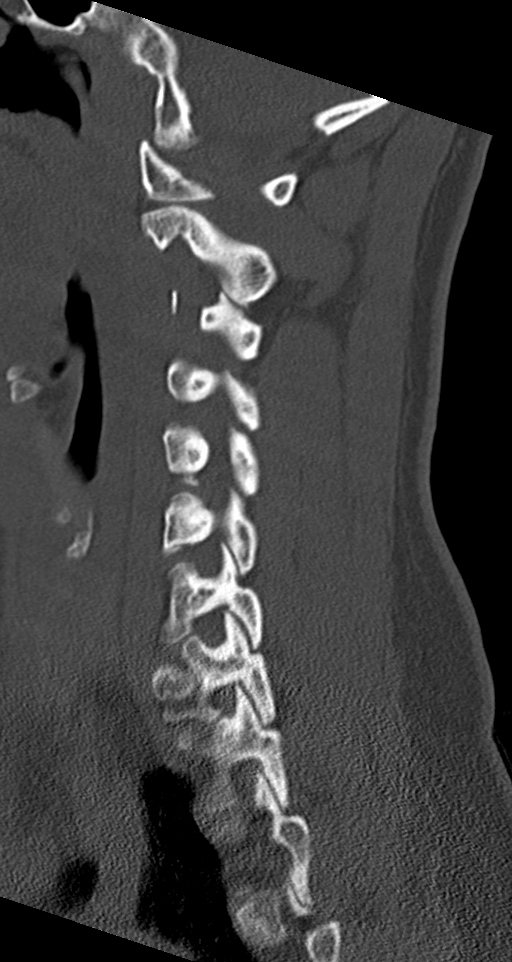

[Series 7: coronal bone · coronal · 0.24mm/px · 3 of 46 slices shown]
[im 10/46  bone]
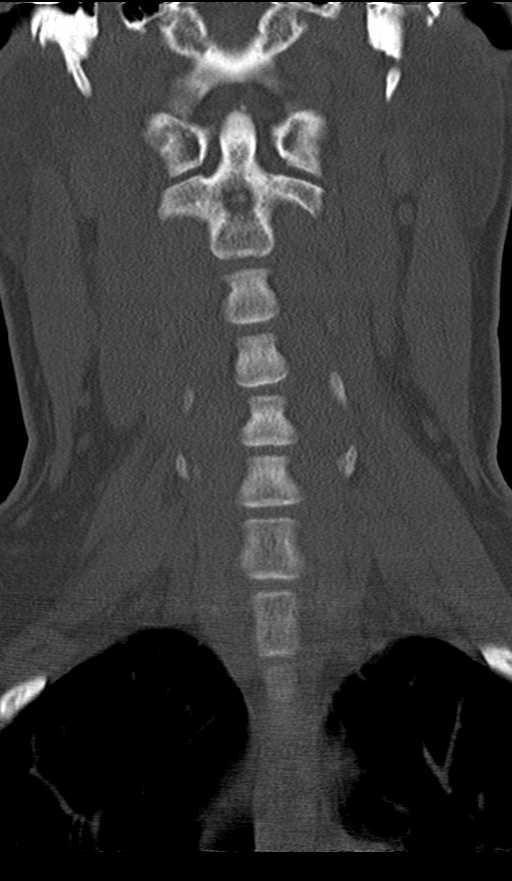
[im 19/46  bone]
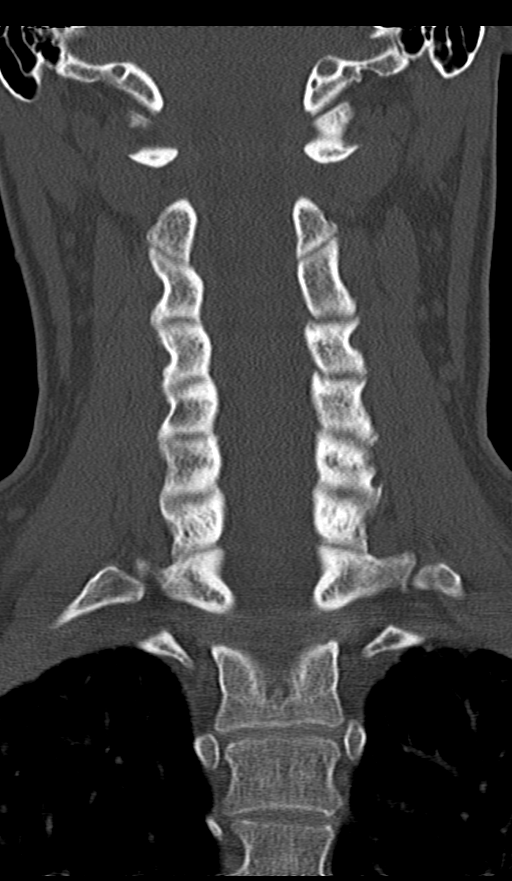
[im 28/46  bone]
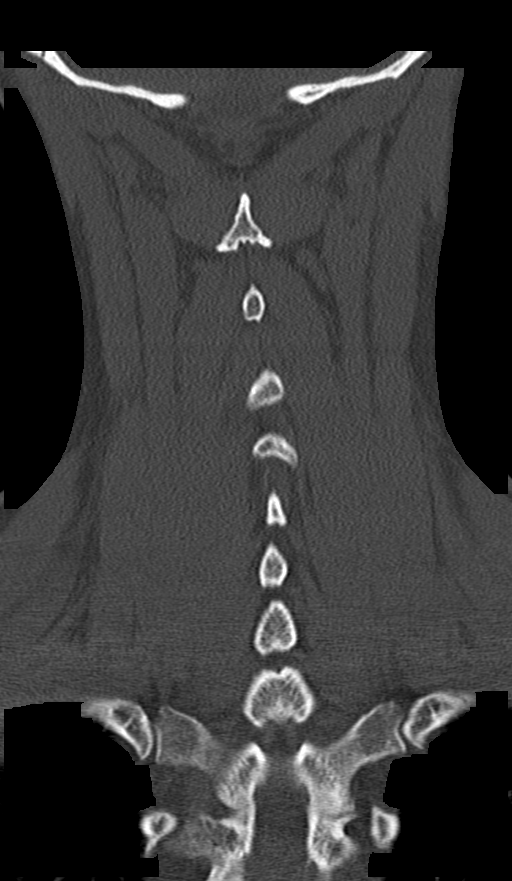

[Series 8: orthogonal bone · axial · 0.23mm/px · z∈[-225,-89]mm · 3 of 109 slices shown, 4 images]
[im 19/109  soft-tissue]
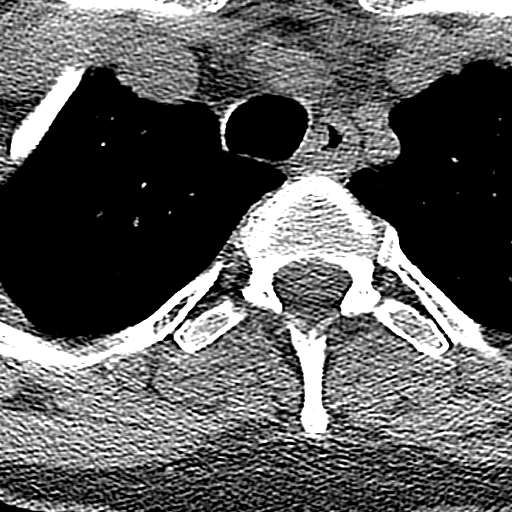
[im 19/109  bone]
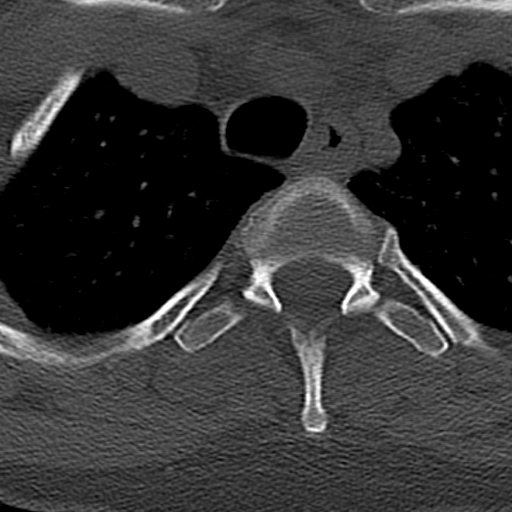
[im 55/109  bone]
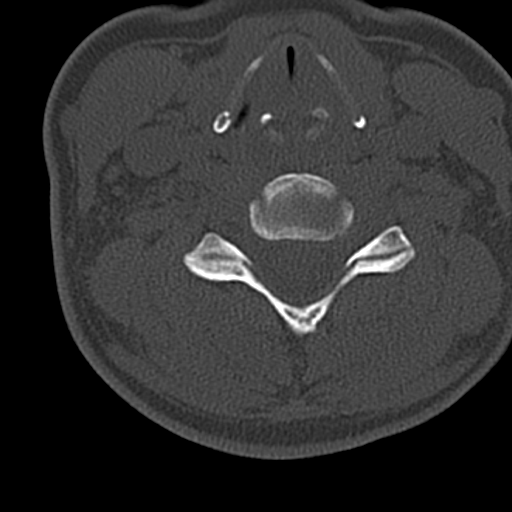
[im 91/109  bone]
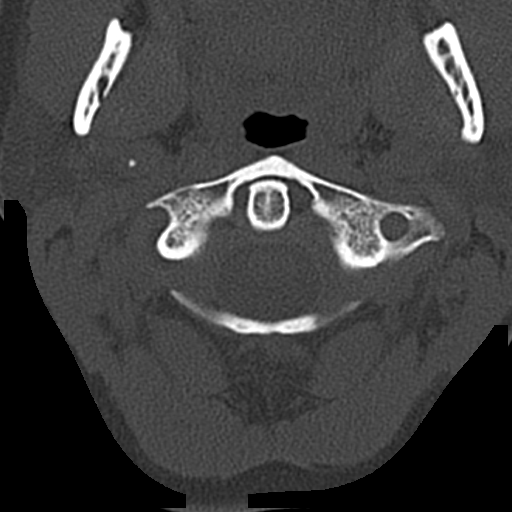

[11 of 33 positions shown; findings below may reference images not displayed]

FINDINGS: Alignment: Normal.

Skull base and vertebrae: No evidence of acute fracture, traumatic
subluxation or focal lesion.

Soft tissues and spinal canal: No prevertebral fluid or swelling. No
visible canal hematoma.

Disc levels: Disc height is maintained at each level. No large disc
herniation, spinal stenosis or nerve root encroachment identified.

Upper chest: Unremarkable.

Other: None.
IMPRESSION: Negative CT of the cervical spine. No explanation for the patient's
symptoms identified.

## 2019-02-08 ENCOUNTER — Telehealth: Payer: Self-pay

## 2019-02-08 NOTE — Telephone Encounter (Signed)
Copied from [place] [id number]. Topic: Referral - Status >> Feb 08, 2019 12:03 PM Rainey Pines A wrote: [facility] needs a callback in regards to patients referral for mammogram at [phone]

## 2019-02-17 ENCOUNTER — Ambulatory Visit
Admission: RE | Admit: 2019-02-17 | Discharge: 2019-02-17 | Disposition: A | Discharge status: Home / Self Care | Payer: 59 | Source: Ambulatory Visit | Attending: Internal Medicine | Admitting: Internal Medicine

## 2019-02-17 DIAGNOSIS — Z1231 Encounter for screening mammogram for malignant neoplasm of breast: Secondary | ICD-10-CM | POA: Diagnosis not present

## 2019-02-17 DIAGNOSIS — Z1239 Encounter for other screening for malignant neoplasm of breast: Secondary | ICD-10-CM

## 2019-04-10 ENCOUNTER — Other Ambulatory Visit: Payer: Self-pay | Admitting: Internal Medicine

## 2019-04-10 DIAGNOSIS — E663 Overweight: Secondary | ICD-10-CM | POA: Insufficient documentation

## 2019-04-10 MED ORDER — PHENTERMINE HCL 37.5 MG PO TABS: 37.5000 mg | ORAL_TABLET | Freq: Every day | ORAL | 2 refills | Status: DC

## 2019-04-23 ENCOUNTER — Ambulatory Visit (INDEPENDENT_AMBULATORY_CARE_PROVIDER_SITE_OTHER): Payer: 59 | Admitting: Internal Medicine

## 2019-04-23 ENCOUNTER — Encounter: Payer: Self-pay | Admitting: Internal Medicine

## 2019-04-23 ENCOUNTER — Other Ambulatory Visit: Payer: Self-pay

## 2019-04-23 DIAGNOSIS — U071 COVID-19: Secondary | ICD-10-CM | POA: Diagnosis not present

## 2019-04-23 DIAGNOSIS — J988 Other specified respiratory disorders: Secondary | ICD-10-CM

## 2019-04-23 MED ORDER — PREDNISONE 10 MG PO TABS: ORAL_TABLET | ORAL | 0 refills | Status: DC

## 2019-04-23 MED ORDER — BENZONATATE 200 MG PO CAPS: 200.0000 mg | ORAL_CAPSULE | Freq: Three times a day (TID) | ORAL | 1 refills | Status: DC | PRN

## 2019-04-23 MED ORDER — HYDROCOD POLST-CPM POLST ER 10-8 MG/5ML PO SUER: 5.0000 mL | Freq: Every evening | ORAL | 0 refills | Status: DC | PRN

## 2019-04-23 MED ORDER — ALBUTEROL SULFATE HFA 108 (90 BASE) MCG/ACT IN AERS: 2.0000 | INHALATION_SPRAY | Freq: Four times a day (QID) | RESPIRATORY_TRACT | 1 refills | Status: DC | PRN

## 2019-04-23 NOTE — Progress Notes (Signed)
Virtual Visit via [email or web address]  This visit type was conducted due to national recommendations for restrictions regarding the COVID-19 pandemic (e.g. social distancing).  This format is felt to be most appropriate for this patient at this time.  All issues noted in this document were discussed and addressed.  No physical exam was performed (except for noted visual exam findings with Video Visits).   I connected with@ on 04/23/19 at  8:30 AM EDT by a video enabled telemedicine application and verified that I am speaking with the correct person using two identifiers. Location patient: home Location provider: work or home office Persons participating in the virtual visit: patient, provider  I discussed the limitations, risks, security and privacy concerns of performing an evaluation and management service by telephone and the availability of in person appointments. I also discussed with the patient that there may be a patient responsible charge related to this service. The patient expressed understanding and agreed to proceed.  Interactive audio and video telecommunications were attempted between this provider and patient, however failed, due to patient having technical difficulties OR patient did not have access to video capability.  We continued and completed visit with audio only. 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   She has not had a formal evaluation or physical exam since diagnosis and returned to work on Oct 21.  Sh e has 12 hour shifts and could not complete a shift on oct 21,  Worked 1/2 day.  Worked a full day yesterday.  Has had persistent chest burning,  Dyspnea with  inability to complete a full sentence while walking.  Not quite as dyspneic with sitting down, but during interview her conversation was frequently interrupted by paroxysmal coughing.  Source of contact was daughter who works at [employer]  And developed symptoms of cough rhinitis without fevers.     ROS: See pertinent positives and negatives per HPI.  Past Medical History:  Diagnosis Date  . Thyroid disease     Past Surgical History:  Procedure Laterality Date  . BREAST BIOPSY Left 05/02/2009  . CHOLECYSTECTOMY  2007    Family History  Problem Relation Age of Onset  . Arthritis Mother   . Asthma Mother   . COPD Mother   . Miscarriages / [place] Mother   . Stroke Mother   . Diabetes Father   . Hyperlipidemia Father   . Hypertension Father   . Diabetes Sister   . Early death Maternal Grandmother   . Heart attack Maternal Grandmother   . Cancer Maternal Grandfather   . Cancer Paternal Grandmother   . Alcohol abuse Paternal Grandfather   . Diabetes Paternal Grandfather   . Early death Paternal Grandfather   . Heart attack Paternal Grandfather   . Learning disabilities Sister   . Breast cancer Maternal Aunt 44    SOCIAL HX:  reports that she has never smoked. She has never used smokeless tobacco. She reports current alcohol use. She reports that she does not use drugs.   Current Outpatient Medications:  .  cetirizine (ZYRTEC) 10 MG tablet, Take 10 mg by mouth daily., Disp: ,  Rfl:  .  ibuprofen (ADVIL,MOTRIN) 800 MG tablet, Take 800 mg by mouth 3 (three) times daily., Disp: , Rfl:  .  levothyroxine (SYNTHROID, LEVOTHROID) 100 MCG tablet, Take 1 tablet (100 mcg total) by mouth daily before breakfast., Disp: 90 tablet, Rfl: 4 .  albuterol (VENTOLIN HFA) 108 (90 Base) MCG/ACT inhaler, Inhale 2 puffs into the lungs every 6 (six) hours as needed for wheezing or shortness of breath., Disp: 8 g, Rfl: 1 .  benzonatate (TESSALON) 200 MG capsule, Take 1 capsule (200 mg  total) by mouth 3 (three) times daily as needed for cough., Disp: 60 capsule, Rfl: 1 .  chlorpheniramine-HYDROcodone (TUSSIONEX PENNKINETIC ER) 10-8 MG/5ML SUER, Take 5 mLs by mouth at bedtime as needed., Disp: 140 mL, Rfl: 0 .  phentermine (ADIPEX-P) 37.5 MG tablet, Take 1 tablet (37.5 mg total) by mouth daily before breakfast. (Patient not taking: Reported on 04/23/2019), Disp: 30 tablet, Rfl: 2 .  predniSONE (DELTASONE) 10 MG tablet, 6 tablets daily for 5 days,  then reduce by 1 tablet daily until gone, Disp: 45 tablet, Rfl: 0  EXAM:  VITALS per patient if applicable:  GENERAL: alert, oriented, appears well and in no acute distress  HEENT: atraumatic, conjunttiva clear, no obvious abnormalities on inspection of external nose and ears  NECK: normal movements of the head and neck  LUNGS: on inspection no signs of respiratory distress, breathing rate appears normal, no obvious gross SOB, gasping or wheezing  CV: no obvious cyanosis  MS: moves all visible extremities without noticeable abnormality  PSYCH/NEURO: pleasant and cooperative, no obvious depression or anxiety, speech and thought processing grossly intact  ASSESSMENT AND PLAN:  Discussed the following assessment and plan:  Respiratory tract infection due to COVID-19 virus  Respiratory tract infection due to COVID-19 virus She has mild desaturations (95  % to 92%) with ambulation and prefers to keep working .  She has completed a quarantine and has been back to work for 3 days.  Prednisone, albuterol MDI,  benzonotate capsules and tussionex  Syrup for nighttine cough prescribed.     I discussed the assessment and treatment plan with the patient. The patient was provided an opportunity to ask questions and all were answered. The patient agreed with the plan and demonstrated an understanding of the instructions.   The patient was advised to call back or seek an in-person evaluation if the symptoms worsen or if the condition  fails to improve as anticipated.  I provided  25 minutes of non-face-to-face time during this encounter reviewing patient's current problems and post surgeries.  Providing counseling on the above mentioned problems , and coordination  of care .  Sherlene Shams, MD

## 2019-04-23 NOTE — Progress Notes (Signed)
Pt was diagnosed with Covid-19 on 04/13/2019 and was cleared to return to work on 04/21/2019. Pt is still experiencing SOBr, cough, loss of smell and taste, fatigue and headaches.

## 2019-04-25 DIAGNOSIS — U071 COVID-19: Secondary | ICD-10-CM

## 2019-04-25 DIAGNOSIS — J988 Other specified respiratory disorders: Secondary | ICD-10-CM

## 2019-04-25 HISTORY — DX: COVID-19: U07.1

## 2019-04-25 HISTORY — DX: Other specified respiratory disorders: J98.8

## 2019-04-25 NOTE — Assessment & Plan Note (Signed)
She has mild desaturations (95  % to 92%) with ambulation and prefers to keep working .  She has completed a quarantine and has been back to work for 3 days.  Prednisone, albuterol MDI,  benzonotate capsules and tussionex  Syrup for nighttine cough prescribed.

## 2019-06-09 ENCOUNTER — Ambulatory Visit: Payer: 59 | Admitting: Internal Medicine

## 2019-07-26 ENCOUNTER — Telehealth: Payer: Self-pay | Admitting: Internal Medicine

## 2019-07-26 NOTE — Telephone Encounter (Signed)
Patient's endocrinologist left and was wondering if Dr. Darrick Huntsman can monitor her thyroid or does she need a specialist?

## 2019-07-27 DIAGNOSIS — E041 Nontoxic single thyroid nodule: Secondary | ICD-10-CM | POA: Diagnosis not present

## 2019-07-27 DIAGNOSIS — E063 Autoimmune thyroiditis: Secondary | ICD-10-CM | POA: Diagnosis not present

## 2019-07-29 NOTE — Telephone Encounter (Signed)
LMTCB

## 2019-07-29 NOTE — Telephone Encounter (Signed)
Given her concurrent thyroid nodule that needs surveillance ,  She should continue with endocrinology follow up

## 2019-08-04 DIAGNOSIS — E041 Nontoxic single thyroid nodule: Secondary | ICD-10-CM | POA: Diagnosis not present

## 2019-08-04 DIAGNOSIS — E063 Autoimmune thyroiditis: Secondary | ICD-10-CM | POA: Diagnosis not present

## 2019-08-05 NOTE — Telephone Encounter (Signed)
Spoke with pt to let her know that Dr. Darrick Huntsman would recommend that she continue to see the specialist because of the concurrent thyroid nodule surveillance that is needed. Pt gave a verbal understanding and stated that she is seeing another provider in the same office now.

## 2019-09-15 ENCOUNTER — Other Ambulatory Visit: Payer: Self-pay | Admitting: Internal Medicine

## 2019-09-15 NOTE — Telephone Encounter (Signed)
Last OV 04/23/19 and last refill 10/20

## 2019-10-15 ENCOUNTER — Telehealth: Payer: 59 | Admitting: Internal Medicine

## 2019-10-15 ENCOUNTER — Other Ambulatory Visit: Payer: Self-pay | Admitting: Neurosurgery

## 2019-10-15 DIAGNOSIS — M542 Cervicalgia: Secondary | ICD-10-CM | POA: Diagnosis not present

## 2019-10-15 DIAGNOSIS — M5412 Radiculopathy, cervical region: Secondary | ICD-10-CM

## 2019-10-15 DIAGNOSIS — Z9889 Other specified postprocedural states: Secondary | ICD-10-CM | POA: Diagnosis not present

## 2019-10-15 DIAGNOSIS — R29898 Other symptoms and signs involving the musculoskeletal system: Secondary | ICD-10-CM | POA: Diagnosis not present

## 2019-10-18 ENCOUNTER — Encounter: Payer: Self-pay | Admitting: Internal Medicine

## 2019-10-18 ENCOUNTER — Telehealth (INDEPENDENT_AMBULATORY_CARE_PROVIDER_SITE_OTHER): Payer: 59 | Admitting: Internal Medicine

## 2019-10-18 ENCOUNTER — Other Ambulatory Visit: Payer: Self-pay

## 2019-10-18 ENCOUNTER — Ambulatory Visit
Admission: RE | Admit: 2019-10-18 | Discharge: 2019-10-18 | Disposition: A | Discharge status: Home / Self Care | Payer: 59 | Source: Ambulatory Visit | Attending: Neurosurgery | Admitting: Neurosurgery

## 2019-10-18 DIAGNOSIS — G9331 Postviral fatigue syndrome: Secondary | ICD-10-CM

## 2019-10-18 DIAGNOSIS — G933 Postviral fatigue syndrome: Secondary | ICD-10-CM | POA: Diagnosis not present

## 2019-10-18 DIAGNOSIS — M5412 Radiculopathy, cervical region: Secondary | ICD-10-CM | POA: Insufficient documentation

## 2019-10-18 DIAGNOSIS — E663 Overweight: Secondary | ICD-10-CM | POA: Diagnosis not present

## 2019-10-18 DIAGNOSIS — M50223 Other cervical disc displacement at C6-C7 level: Secondary | ICD-10-CM | POA: Diagnosis not present

## 2019-10-18 MED ORDER — PHENTERMINE HCL 37.5 MG PO TABS: 37.5000 mg | ORAL_TABLET | Freq: Every day | ORAL | 2 refills | Status: DC

## 2019-10-18 NOTE — Progress Notes (Signed)
Virtual Visit via [place]  This visit type was conducted due to national recommendations for restrictions regarding the COVID-19 pandemic (e.g. social distancing).  This format is felt to be most appropriate for this patient at this time.  All issues noted in this document were discussed and addressed.  No physical exam was performed (except for noted visual exam findings with Video Visits).   I connected with@ on 10/18/19 at  4:30 PM EDT by a video enabled telemedicine application  and verified that I am speaking with the correct person using two identifiers. Location patient: home Location provider: work or home office Persons participating in the virtual visit: patient, provider  I discussed the limitations, risks, security and privacy concerns of performing an evaluation and management service by telephone and the availability of in person appointments. I also discussed with the patient that there may be a patient responsible charge related to this service. The patient expressed understanding and agreed to proceed.   Reason for visit:  follow up   On COVID 19 INFECTION and overweight   HPI:  39 yr old female last seen in October during RECOVERY from [place] .  PATIENT continues to have profound fatigue and periodic/episodci shortness of breath managed with albuterol.  She had suspended the phentermine during her acute illness,  And after resuming the medication in late November,  Lost 18 lbs,  Down to 178 lbs  .  She ran out of the medication in January and has gained 20 lbs due to inability to exercise due to fatigue and recurrent neck injury,  As well as due to poor eating habits secondary to persistent loss of smell and regain of taste for salty and sweet foods.   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   She has invested in the  [employer] and plans to start this as soon as it is delivered.   The cost is $300 /month  MRI cervical spine done today     ROS: See pertinent positives and negatives per HPI.  Past Medical History:  Diagnosis Date  . Thyroid disease     Past Surgical History:  Procedure Laterality Date  . BREAST BIOPSY Left 05/02/2009  . CHOLECYSTECTOMY  2007    Family History  Problem Relation Age of Onset  . Arthritis Mother   . Asthma Mother   . COPD Mother   . Miscarriages / [place] Mother   . Stroke Mother   . Diabetes Father   . Hyperlipidemia Father   . Hypertension Father   . Diabetes Sister   . Early death Maternal Grandmother   . Heart attack Maternal Grandmother   . Cancer Maternal Grandfather   . Cancer Paternal Grandmother   . Alcohol abuse Paternal Grandfather   . Diabetes Paternal Grandfather   . Early death Paternal Grandfather   . Heart attack Paternal Grandfather   . Learning disabilities Sister   . Breast cancer Maternal Aunt 60    SOCIAL HX:  reports that she has never smoked. She has never used smokeless tobacco. She reports current alcohol use. She reports that she does not use drugs.   Current Outpatient Medications:  .  albuterol (VENTOLIN HFA) 108 (90 Base) MCG/ACT inhaler, Inhale 2 puffs into the lungs every 6 (six) hours as needed for wheezing or shortness of breath., Disp: 8 g, Rfl: 1 .  cetirizine (ZYRTEC) 10  MG tablet, Take 10 mg by mouth daily., Disp: , Rfl:  .  levothyroxine (SYNTHROID, LEVOTHROID) 100 MCG tablet, Take 1 tablet (100 mcg total) by mouth daily before breakfast., Disp: 90 tablet, Rfl: 4 .  ibuprofen (ADVIL,MOTRIN) 800 MG tablet, Take 800 mg by mouth 3 (three) times daily., Disp: , Rfl:  .  phentermine (ADIPEX-P) 37.5 MG tablet, Take 1 tablet (37.5 mg total) by mouth daily before breakfast., Disp: 30 tablet, Rfl: 2  EXAM:  VITALS per patient if applicable:  GENERAL: alert, oriented, appears well and in no acute distress  HEENT: atraumatic, conjunttiva clear, no obvious abnormalities on  inspection of external nose and ears  NECK: normal movements of the head and neck  LUNGS: on inspection no signs of respiratory distress, breathing rate appears normal, no obvious gross SOB, gasping or wheezing  CV: no obvious cyanosis  MS: moves all visible extremities without noticeable abnormality  PSYCH/NEURO: pleasant and cooperative, no obvious depression or anxiety, speech and thought processing grossly intact  ASSESSMENT AND PLAN:  Discussed the following assessment and plan:  Overweight (BMI 25.0-29.9)  Post viral syndrome - Plan: CBC with Differential/Platelet, Comprehensive metabolic panel, TSH  Overweight (BMI 25.0-29.9) She had success with phentermine from November to January but was lost to follow up and has regained weight.  She was too fatigued to exercise following her covid 19 INFECTION  But is willing to start a program of walking and feels that the phentermine gave her enough energy to do so previously.  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 However I will ask her to have labs repeated since her last thyroid screen was over 2 yrs ago   Lab Results  Component Value Date   TSH 1.270 03/10/2017       I discussed the assessment and treatment plan with the patient. The patient was provided an opportunity to ask questions and all were answered. The patient agreed with the plan and demonstrated an understanding of the instructions.   The patient was advised to call back or seek an in-person evaluation if the symptoms worsen or if the condition fails to improve as anticipated.  I provided  30 minutes of non-face-to-face time during this encounter reviewing patient's current problems and past surgeries, labs and imaging studies,  providing counseling on the above mentioned problems , and coordination  of care .   Sherlene Shams, MD

## 2019-10-19 DIAGNOSIS — Z6827 Body mass index (BMI) 27.0-27.9, adult: Secondary | ICD-10-CM | POA: Diagnosis not present

## 2019-10-19 DIAGNOSIS — R030 Elevated blood-pressure reading, without diagnosis of hypertension: Secondary | ICD-10-CM | POA: Diagnosis not present

## 2019-10-19 DIAGNOSIS — M542 Cervicalgia: Secondary | ICD-10-CM | POA: Diagnosis not present

## 2019-10-19 DIAGNOSIS — G9331 Postviral fatigue syndrome: Secondary | ICD-10-CM | POA: Insufficient documentation

## 2019-10-19 DIAGNOSIS — G933 Postviral fatigue syndrome: Secondary | ICD-10-CM | POA: Insufficient documentation

## 2019-10-19 DIAGNOSIS — Z9889 Other specified postprocedural states: Secondary | ICD-10-CM | POA: Diagnosis not present

## 2019-10-19 DIAGNOSIS — M5412 Radiculopathy, cervical region: Secondary | ICD-10-CM | POA: Diagnosis not present

## 2019-10-19 NOTE — Assessment & Plan Note (Signed)
She had success with phentermine from November to January but was lost to follow up and has regained weight.  She was too fatigued to exercise following her covid 19 INFECTION  But is willing to start a program of walking and feels that the phentermine gave her enough energy to do so previously.  She is also staring to [employer]  Refills authorized for 3 months,  Cautioned to stop phentermine or reduce dose if wt loss exceeds 8 lbs in a month

## 2019-10-19 NOTE — Assessment & Plan Note (Signed)
She reports persistent post COVID 19 fatigue without other symptoms suggestive of thyroid hypofunction. However I will ask her to have labs repeated since her last thyroid screen was over 2 yrs ago   Lab Results  Component Value Date   TSH 1.270 03/10/2017

## 2020-05-12 ENCOUNTER — Telehealth: Payer: Self-pay | Admitting: Internal Medicine

## 2020-05-12 NOTE — Telephone Encounter (Signed)
Please fax to [phone] when complete

## 2020-05-12 NOTE — Telephone Encounter (Signed)
Pt dropped off a CPE form to be filled out  Placed in Dr. Melina Schools color folder upfront

## 2020-05-15 DIAGNOSIS — Z0279 Encounter for issue of other medical certificate: Secondary | ICD-10-CM

## 2020-05-15 NOTE — Telephone Encounter (Signed)
Placed in quick sign folder.  

## 2020-05-15 NOTE — Telephone Encounter (Signed)
Signed and returned in orange folder  

## 2020-05-15 NOTE — Telephone Encounter (Signed)
Faxed

## 2020-05-18 ENCOUNTER — Telehealth: Payer: Self-pay

## 2020-05-18 NOTE — Telephone Encounter (Signed)
Pt calling for proof of TDAP.  [phone] or [phone]  Adv pt will need to pull paper chart; also sugg the [facility].

## 2020-06-01 NOTE — Telephone Encounter (Signed)
Paper chart reviewed. Notified Hospital D/C Summary 11/2012 states TDAP received within the past 5 years. However, no records in the paper chart of her receiving it at [facility].

## 2020-06-13 ENCOUNTER — Other Ambulatory Visit: Payer: Self-pay

## 2020-06-13 ENCOUNTER — Ambulatory Visit (INDEPENDENT_AMBULATORY_CARE_PROVIDER_SITE_OTHER): Payer: 59 | Admitting: Internal Medicine

## 2020-06-13 ENCOUNTER — Encounter: Payer: Self-pay | Admitting: Internal Medicine

## 2020-06-13 VITALS — BP 108/78 | HR 90 | Temp 98.3°F | Ht 70.98 in | Wt 174.2 lb

## 2020-06-13 DIAGNOSIS — Z0000 Encounter for general adult medical examination without abnormal findings: Secondary | ICD-10-CM | POA: Diagnosis not present

## 2020-06-13 DIAGNOSIS — Z1159 Encounter for screening for other viral diseases: Secondary | ICD-10-CM

## 2020-06-13 DIAGNOSIS — Z5320 Procedure and treatment not carried out because of patient's decision for unspecified reasons: Secondary | ICD-10-CM

## 2020-06-13 DIAGNOSIS — E663 Overweight: Secondary | ICD-10-CM

## 2020-06-13 DIAGNOSIS — E039 Hypothyroidism, unspecified: Secondary | ICD-10-CM | POA: Diagnosis not present

## 2020-06-13 DIAGNOSIS — Z23 Encounter for immunization: Secondary | ICD-10-CM

## 2020-06-13 DIAGNOSIS — R5383 Other fatigue: Secondary | ICD-10-CM

## 2020-06-13 DIAGNOSIS — E041 Nontoxic single thyroid nodule: Secondary | ICD-10-CM | POA: Diagnosis not present

## 2020-06-13 DIAGNOSIS — Z124 Encounter for screening for malignant neoplasm of cervix: Secondary | ICD-10-CM

## 2020-06-13 NOTE — Progress Notes (Signed)
Patient ID: Sharon Weber, female    DOB: 1980/08/17  Age: 39 y.o. MRN: [id number]  The patient is here for annual wellness examination and management of other chronic and acute problems.   The risk factors are reflected in the social history.  The roster of all physicians providing medical care to patient - is listed in the Snapshot section of the chart.  Activities of daily living:  The patient is 100% independent in all ADLs: dressing, toileting, feeding as well as independent mobility  Home safety : The patient has smoke detectors in the home. They wear seatbelts.  There are no firearms at home. There is no violence in the home.   There is no risks for hepatitis, STDs or HIV. There is no   history of blood transfusion. They have no travel history to infectious disease endemic areas of the world.  The patient has seen their dentist in the last six month. They have seen their eye doctor in the last year. They admit to slight hearing difficulty with regard to whispered voices and some television programs.  They have deferred audiologic testing in the last year.  They do not  have excessive sun exposure. Discussed the need for sun protection: hats, long sleeves and use of sunscreen if there is significant sun exposure.   Diet: the importance of a healthy diet is discussed. They do have a healthy diet.  The benefits of regular aerobic exercise were discussed. She walks 4 times per week ,  20 minutes.   Depression screen: there are no signs or vegative symptoms of depression- irritability, change in appetite, anhedonia, sadness/tearfullness.  Cognitive assessment: the patient manages all their financial and personal affairs and is actively engaged. They could relate day,date,year and events; recalled 2/3 objects at 3 minutes; performed clock-face test normally.  The following portions of the patient's history were reviewed and updated as appropriate: allergies, current medications, past family  history, past medical history,  past surgical history, past social history  and problem list.  Visual acuity was not assessed per patient preference since she has regular follow up with her ophthalmologist. Hearing and body mass index were assessed and reviewed.   During the course of the visit the patient was educated and counseled about appropriate screening and preventive services including : fall prevention , diabetes screening, nutrition counseling, colorectal cancer screening, and recommended immunizations.    CC: The primary encounter diagnosis was Cervical cancer screening. Diagnoses of Thyroid nodule, Fatigue, unspecified type, Acquired hypothyroidism, COVID-19 vaccine administered, Screening for hepatitis C declined, Encounter for hepatitis C screening test for low risk patient, Encounter for preventive health examination, and Overweight (BMI 25.0-29.9) were also pertinent to this visit.   Weight loss achieved with [employer].  30 lbs .  Has gained back  5 lbs since Thanksgiving ,  Not exercising due to 12 hour shifts  Now doing 3  SHIFTS  Maternal aunt had BRCA  But sisters and mom have fibrocystic    History Sharon Weber has a past medical history of Thyroid disease.   She has a past surgical history that includes Cholecystectomy (2007) and Breast biopsy (Left, 05/02/2009).   Her family history includes Alcohol abuse in her paternal grandfather; Arthritis in her mother; Asthma in her mother; Breast cancer (age of onset: 58) in her maternal aunt; COPD in her mother; Cancer in her maternal grandfather and paternal grandmother; Diabetes in her father, paternal grandfather, and sister; Early death in her maternal grandmother and paternal grandfather; Heart  attack in her maternal grandmother and paternal grandfather; Hyperlipidemia in her father; Hypertension in her father; Learning disabilities in her sister; Miscarriages / Stillbirths in her mother; Stroke in her mother.She reports that she has  never smoked. She has never used smokeless tobacco. She reports current alcohol use. She reports that she does not use drugs.  Outpatient Medications Prior to Visit  Medication Sig Dispense Refill  . albuterol (VENTOLIN HFA) 108 (90 Base) MCG/ACT inhaler Inhale 2 puffs into the lungs every 6 (six) hours as needed for wheezing or shortness of breath. 8 g 1  . cetirizine (ZYRTEC) 10 MG tablet Take 10 mg by mouth daily.    Marland Kitchen ibuprofen (ADVIL,MOTRIN) 800 MG tablet Take 800 mg by mouth 3 (three) times daily.    Marland Kitchen levothyroxine (SYNTHROID, LEVOTHROID) 100 MCG tablet Take 1 tablet (100 mcg total) by mouth daily before breakfast. 90 tablet 4  . phentermine (ADIPEX-P) 37.5 MG tablet Take 1 tablet (37.5 mg total) by mouth daily before breakfast. (Patient not taking: Reported on 06/13/2020) 30 tablet 2   No facility-administered medications prior to visit.    Review of Systems   Patient denies headache, fevers, malaise, unintentional weight loss, skin rash, eye pain, sinus congestion and sinus pain, sore throat, dysphagia,  hemoptysis , cough, dyspnea, wheezing, chest pain, palpitations, orthopnea, edema, abdominal pain, nausea, melena, diarrhea, constipation, flank pain, dysuria, hematuria, urinary  Frequency, nocturia, numbness, tingling, seizures,  Focal weakness, Loss of consciousness,  Tremor, insomnia, depression, anxiety, and suicidal ideation.      Objective:  BP 108/78 (BP Location: Left Arm, Patient Position: Sitting)   Pulse 90   Temp 98.3 F (36.8 C)   Ht 5' 10.98" (1.803 m)   Wt 174 lb 3.2 oz (79 kg)   SpO2 99%   BMI 24.31 kg/m   Physical Exam   .General appearance: alert, cooperative and appears stated age Head: Normocephalic, without obvious abnormality, atraumatic Eyes: conjunctivae/corneas clear. PERRL, EOM's intact. Fundi benign. Ears: normal TM's and external ear canals both ears Nose: Nares normal. Septum midline. Mucosa normal. No drainage or sinus tenderness. Throat:  lips, mucosa, and tongue normal; teeth and gums normal Neck: no adenopathy, no carotid bruit, no JVD, supple, symmetrical, trachea midline and thyroid not enlarged, symmetric, no tenderness/mass/nodules Lungs: clear to auscultation bilaterally Breasts: normal appearance, no masses or tenderness Heart: regular rate and rhythm, S1, S2 normal, no murmur, click, rub or gallop Abdomen: soft, non-tender; bowel sounds normal; no masses,  no organomegaly GYN deferred by patient today Extremities: extremities normal, atraumatic, no cyanosis or edema Pulses: 2+ and symmetric Skin: Skin color, texture, turgor normal. No rashes or lesions Neurologic: Alert and oriented X 3, normal strength and tone. Normal symmetric reflexes. Normal coordination and gait.      Assessment & Plan:   Problem List Items Addressed This Visit      Unprioritized   Hypothyroidism    Managed with levothyroxine.  By [facility] Endocrinology Lysbeth Galas.  tsh is due      Relevant Orders   TSH   T4, free   Thyroid nodule    Last ultrasound Feb 2021 [facility] managed      Encounter for preventive health examination    age appropriate education and counseling updated, referrals for preventative services and immunizations addressed, dietary and smoking counseling addressed, most recent labs reviewed.  I have personally reviewed and have noted:  1) the patient's medical and social history 2) The pt's use of alcohol, tobacco, and illicit drugs 3)  The patient's current medications and supplements 4) Functional ability including ADL's, fall risk, home safety risk, hearing and visual impairment 5) Diet and physical activities 6) Evidence for depression or mood disorder 7) The patient's height, weight, and BMI have been recorded in the chart  I have made GYN referral and provided counseling and education based on review of the above      Overweight (BMI 25.0-29.9)    I have congratulated her in reduction of   BMIusing the [employer]   and encouraged  regular exercise a minimum of 5 days per week.         Other Visit Diagnoses    Cervical cancer screening    -  Primary   Relevant Orders   Ambulatory referral to Obstetrics / Gynecology   Fatigue, unspecified type       Relevant Orders   CBC with Differential/Platelet   Comprehensive metabolic panel   [id number] vaccine administered       Relevant Orders   SARS-CoV-2 Semi-Quantitative Total Antibody, Spike   Screening for hepatitis C declined       Encounter for hepatitis C screening test for low risk patient       Relevant Orders   Hepatitis C antibody      I have discontinued Jazira L. Zentz's phentermine. I am also having her maintain her levothyroxine, cetirizine, ibuprofen, and albuterol.  No orders of the defined types were placed in this encounter.   Medications Discontinued During This Encounter  Medication Reason  . phentermine (ADIPEX-P) 37.5 MG tablet Completed Course    Follow-up: No follow-ups on file.   Crecencio Mc, MD

## 2020-06-13 NOTE — Assessment & Plan Note (Signed)
I have congratulated her in reduction of   BMIusing the [employer]  and encouraged  regular exercise a minimum of 5 days per week.

## 2020-06-13 NOTE — Assessment & Plan Note (Signed)
Last ultrasound Feb 2021 [facility] managed

## 2020-06-13 NOTE — Assessment & Plan Note (Signed)

## 2020-06-13 NOTE — Assessment & Plan Note (Signed)
Managed with levothyroxine.  By [facility] Endocrinology Sheria Lang.  tsh is due

## 2020-06-13 NOTE — Patient Instructions (Signed)
I have mADE THE gyn REFERRAL !   CONGRATULATIONS ON THE WEIGHT LOSS  GET BACK TO REGULAR EXERCISE    Health Maintenance, Female Adopting a healthy lifestyle and getting preventive care are important in promoting health and wellness. Ask your health care provider about:  The right schedule for you to have regular tests and exams.  Things you can do on your own to prevent diseases and keep yourself healthy. What should I know about diet, weight, and exercise? Eat a healthy diet   Eat a diet that includes plenty of vegetables, fruits, low-fat dairy products, and lean protein.  Do not eat a lot of foods that are high in solid fats, added sugars, or sodium. Maintain a healthy weight Body mass index (BMI) is used to identify weight problems. It estimates body fat based on height and weight. Your health care provider can help determine your BMI and help you achieve or maintain a healthy weight. Get regular exercise Get regular exercise. This is one of the most important things you can do for your health. Most adults should:  Exercise for at least 150 minutes each week. The exercise should increase your heart rate and make you sweat (moderate-intensity exercise).  Do strengthening exercises at least twice a week. This is in addition to the moderate-intensity exercise.  Spend less time sitting. Even light physical activity can be beneficial. Watch cholesterol and blood lipids Have your blood tested for lipids and cholesterol at 38 years of age, then have this test every 5 years. Have your cholesterol levels checked more often if:  Your lipid or cholesterol levels are high.  You are older than 38 years of age.  You are at high risk for heart disease. What should I know about cancer screening? Depending on your health history and family history, you may need to have cancer screening at various ages. This may include screening for:  Breast cancer.  Cervical cancer.  Colorectal  cancer.  Skin cancer.  Lung cancer. What should I know about heart disease, diabetes, and high blood pressure? Blood pressure and heart disease  High blood pressure causes heart disease and increases the risk of stroke. This is more likely to develop in people who have high blood pressure readings, are of African descent, or are overweight.  Have your blood pressure checked: ? Every 3-5 years if you are 74-92 years of age. ? Every year if you are 51 years old or older. Diabetes Have regular diabetes screenings. This checks your fasting blood sugar level. Have the screening done:  Once every three years after age 52 if you are at a normal weight and have a low risk for diabetes.  More often and at a younger age if you are overweight or have a high risk for diabetes. What should I know about preventing infection? Hepatitis B If you have a higher risk for hepatitis B, you should be screened for this virus. Talk with your health care provider to find out if you are at risk for hepatitis B infection. Hepatitis C Testing is recommended for:  Everyone born from [birth year] through 1964.  Anyone with known risk factors for hepatitis C. Sexually transmitted infections (STIs)  Get screened for STIs, including gonorrhea and chlamydia, if: ? You are sexually active and are younger than 38 years of age. ? You are older than 38 years of age and your health care provider tells you that you are at risk for this type of infection. ? Your sexual activity  has changed since you were last screened, and you are at increased risk for chlamydia or gonorrhea. Ask your health care provider if you are at risk.  Ask your health care provider about whether you are at high risk for HIV. Your health care provider may recommend a prescription medicine to help prevent HIV infection. If you choose to take medicine to prevent HIV, you should first get tested for HIV. You should then be tested every 3 months for as long as  you are taking the medicine. Pregnancy  If you are about to stop having your period (premenopausal) and you may become pregnant, seek counseling before you get pregnant.  Take 400 to 800 micrograms (mcg) of folic acid every day if you become pregnant.  Ask for birth control (contraception) if you want to prevent pregnancy. Osteoporosis and menopause Osteoporosis is a disease in which the bones lose minerals and strength with aging. This can result in bone fractures. If you are 13 years old or older, or if you are at risk for osteoporosis and fractures, ask your health care provider if you should:  Be screened for bone loss.  Take a calcium or vitamin D supplement to lower your risk of fractures.  Be given hormone replacement therapy (HRT) to treat symptoms of menopause. Follow these instructions at home: Lifestyle  Do not use any products that contain nicotine or tobacco, such as cigarettes, e-cigarettes, and chewing tobacco. If you need help quitting, ask your health care provider.  Do not use street drugs.  Do not share needles.  Ask your health care provider for help if you need support or information about quitting drugs. Alcohol use  Do not drink alcohol if: ? Your health care provider tells you not to drink. ? You are pregnant, may be pregnant, or are planning to become pregnant.  If you drink alcohol: ? Limit how much you use to 0-1 drink a day. ? Limit intake if you are breastfeeding.  Be aware of how much alcohol is in your drink. In the [place] one drink equals one 12 oz bottle of beer (355 mL), one 5 oz glass of wine (148 mL), or one 1 oz glass of hard liquor (44 mL). General instructions  Schedule regular health, dental, and eye exams.  Stay current with your vaccines.  Tell your health care provider if: ? You often feel depressed. ? You have ever been abused or do not feel safe at home. Summary  Adopting a healthy lifestyle and getting preventive care are  important in promoting health and wellness.  Follow your health care provider's instructions about healthy diet, exercising, and getting tested or screened for diseases.  Follow your health care provider's instructions on monitoring your cholesterol and blood pressure. This information is not intended to replace advice given to you by your health care provider. Make sure you discuss any questions you have with your health care provider. Document Revised: 06/10/2018 Document Reviewed: 06/10/2018 Elsevier Patient Education  2020 [employer].

## 2020-06-14 LAB — COMPREHENSIVE METABOLIC PANEL
ALT: 11 U/L (ref 0–35)
AST: 13 U/L (ref 0–37)
Albumin: 4.6 g/dL (ref 3.5–5.2)
Alkaline Phosphatase: 43 U/L (ref 39–117)
BUN: 17 mg/dL (ref 6–23)
CO2: 27 mEq/L (ref 19–32)
Calcium: 9.3 mg/dL (ref 8.4–10.5)
Chloride: 102 mEq/L (ref 96–112)
Creatinine, Ser: 1.12 mg/dL (ref 0.40–1.20)
GFR: 62.45 mL/min (ref >60.00)
Glucose, Bld: 82 mg/dL (ref 70–99)
Potassium: 3.9 mEq/L (ref 3.5–5.1)
Sodium: 138 mEq/L (ref 135–145)
Total Bilirubin: 0.4 mg/dL (ref 0.2–1.2)
Total Protein: 7 g/dL (ref 6.0–8.3)

## 2020-06-14 LAB — CBC WITH DIFFERENTIAL/PLATELET
Basophils Absolute: 0.1 10*3/uL (ref 0.0–0.1)
Basophils Relative: 0.6 % (ref 0.0–3.0)
Eosinophils Absolute: 0.1 10*3/uL (ref 0.0–0.7)
Eosinophils Relative: 1.4 % (ref 0.0–5.0)
HCT: 38 % (ref 36.0–46.0)
Hemoglobin: 12.8 g/dL (ref 12.0–15.0)
Lymphocytes Relative: 41.9 % (ref 12.0–46.0)
Lymphs Abs: 3.7 10*3/uL (ref 0.7–4.0)
MCHC: 33.7 g/dL (ref 30.0–36.0)
MCV: 86.8 fl (ref 78.0–100.0)
Monocytes Absolute: 0.5 10*3/uL (ref 0.1–1.0)
Monocytes Relative: 5.9 % (ref 3.0–12.0)
Neutro Abs: 4.5 10*3/uL (ref 1.4–7.7)
Neutrophils Relative %: 50.2 % (ref 43.0–77.0)
Platelets: 350 10*3/uL (ref 150.0–400.0)
RBC: 4.38 Mil/uL (ref 3.87–5.11)
RDW: 13.5 % (ref 11.5–15.5)
WBC: 8.9 10*3/uL (ref 4.0–10.5)

## 2020-06-14 LAB — HEPATITIS C ANTIBODY
Hepatitis C Ab: NONREACTIVE
SIGNAL TO CUT-OFF: 0.01 (ref <1.00)

## 2020-06-14 LAB — TSH: TSH: 2.01 u[IU]/mL (ref 0.35–4.50)

## 2020-06-14 LAB — T4, FREE: Free T4: 1.03 ng/dL (ref 0.60–1.60)

## 2020-06-15 ENCOUNTER — Other Ambulatory Visit: Payer: Self-pay | Admitting: Internal Medicine

## 2020-06-15 MED ORDER — FLUTICASONE PROPIONATE 50 MCG/ACT NA SUSP: 2.0000 | Freq: Every day | NASAL | 6 refills | Status: DC

## 2020-06-16 LAB — SARS-COV-2 SEMI-QUANTITATIVE TOTAL ANTIBODY, SPIKE: SARS COV2 AB, Total Spike Semi QN: >2500 U/mL — ABNORMAL HIGH (ref <0.8)

## 2020-07-26 LAB — HM PAP SMEAR: HM Pap smear: NORMAL

## 2020-08-22 ENCOUNTER — Telehealth: Payer: Self-pay | Admitting: Internal Medicine

## 2020-08-29 ENCOUNTER — Other Ambulatory Visit: Payer: Self-pay | Admitting: Internal Medicine

## 2020-09-29 ENCOUNTER — Other Ambulatory Visit: Payer: Self-pay | Admitting: Internal Medicine

## 2020-09-29 MED ORDER — SCOPOLAMINE 1 MG/3DAYS TD PT72: 1.0000 | MEDICATED_PATCH | TRANSDERMAL | 0 refills | Status: DC

## 2020-10-04 DIAGNOSIS — E039 Hypothyroidism, unspecified: Secondary | ICD-10-CM | POA: Diagnosis not present

## 2020-10-09 ENCOUNTER — Other Ambulatory Visit: Payer: Self-pay

## 2020-10-09 MED ORDER — LEVOTHYROXINE SODIUM 100 MCG PO TABS: 100.0000 ug | ORAL_TABLET | ORAL | 3 refills | Status: DC

## 2020-10-09 MED FILL — Levothyroxine Sodium Tab 100 MCG: ORAL | 90 days supply | Qty: 90 | Fill #0 | Status: CN

## 2020-11-21 ENCOUNTER — Other Ambulatory Visit: Payer: Self-pay

## 2020-11-21 MED FILL — Levothyroxine Sodium Tab 100 MCG: ORAL | 90 days supply | Qty: 90 | Fill #0 | Status: CN

## 2020-11-29 ENCOUNTER — Other Ambulatory Visit: Payer: Self-pay

## 2020-11-29 MED FILL — Levothyroxine Sodium Tab 100 MCG: ORAL | 30 days supply | Qty: 30 | Fill #0 | Status: AC

## 2020-11-29 MED FILL — Levothyroxine Sodium Tab 100 MCG: ORAL | 30 days supply | Qty: 30 | Fill #0 | Status: CN

## 2020-12-04 ENCOUNTER — Other Ambulatory Visit: Payer: Self-pay

## 2021-01-02 ENCOUNTER — Telehealth: Payer: 59 | Admitting: Physician Assistant

## 2021-01-02 NOTE — Progress Notes (Signed)
Patient out of state in [place] and cannot be evaluated via e-visit or VUCV. Message sent to patient with recommendations for care.

## 2021-01-25 ENCOUNTER — Other Ambulatory Visit: Payer: Self-pay

## 2021-01-25 ENCOUNTER — Encounter: Payer: Self-pay | Admitting: Internal Medicine

## 2021-01-25 ENCOUNTER — Ambulatory Visit: Payer: 59 | Admitting: Internal Medicine

## 2021-01-25 DIAGNOSIS — E663 Overweight: Secondary | ICD-10-CM | POA: Diagnosis not present

## 2021-01-25 DIAGNOSIS — H8102 Meniere's disease, left ear: Secondary | ICD-10-CM | POA: Diagnosis not present

## 2021-01-25 DIAGNOSIS — H65192 Other acute nonsuppurative otitis media, left ear: Secondary | ICD-10-CM

## 2021-01-25 MED ORDER — LEVOFLOXACIN 500 MG PO TABS: 500.0000 mg | ORAL_TABLET | Freq: Every day | ORAL | 0 refills | Status: AC

## 2021-01-25 MED ORDER — PREDNISONE 10 MG PO TABS: ORAL_TABLET | ORAL | 0 refills | Status: DC

## 2021-01-25 MED ORDER — SEMAGLUTIDE-WEIGHT MANAGEMENT 1.7 MG/0.75ML ~~LOC~~ SOAJ: 1.7000 mg | SUBCUTANEOUS | 0 refills | Status: DC

## 2021-01-25 MED ORDER — SEMAGLUTIDE-WEIGHT MANAGEMENT 1 MG/0.5ML ~~LOC~~ SOAJ: 1.0000 mg | SUBCUTANEOUS | 0 refills | Status: AC

## 2021-01-25 MED ORDER — SEMAGLUTIDE-WEIGHT MANAGEMENT 0.25 MG/0.5ML ~~LOC~~ SOAJ: 0.2500 mg | SUBCUTANEOUS | 0 refills | Status: DC

## 2021-01-25 MED ORDER — SEMAGLUTIDE-WEIGHT MANAGEMENT 2.4 MG/0.75ML ~~LOC~~ SOAJ: 2.4000 mg | SUBCUTANEOUS | 0 refills | Status: DC

## 2021-01-25 MED ORDER — SEMAGLUTIDE-WEIGHT MANAGEMENT 0.5 MG/0.5ML ~~LOC~~ SOAJ: 0.5000 mg | SUBCUTANEOUS | 0 refills | Status: AC

## 2021-01-25 NOTE — Patient Instructions (Signed)
Refills of levaquin and prednisone are on file at [facility] in the event that symptoms return.  Stay on the flonase, Afrin,  ear drops (if available) and sudafed 30 mg every 6 hours for another week   Postpone hearing test to at least mid august   Give the vertigo another 2-3 weeks to see if it resolves.

## 2021-01-25 NOTE — Progress Notes (Signed)
Subjective:  Patient ID: Sharon Weber, female    DOB: [birth year]  Age: 40 y.o. MRN: [id number]  CC: Diagnoses of Acute otitis media with effusion of left ear, Overweight (BMI 25.0-29.9), and Vertigo, labyrinthine, left were pertinent to this visit.:  l This visit occurred during the SARS-CoV-2 public health emergency.  Safety protocols were in place, including screening questions prior to the visit, additional usage of staff PPE, and extensive cleaning of exam room while observing appropriate contact time as indicated for disinfecting solutions.  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 Symptoms  progressed from fullness to pain to hearing loss by the same evening despite use of Nettie Pott and Afrin  and oral decongestants.  She was treated on  Sunday by an urgent care in [place] and was treated with solumedrol and amox, by July 8 pain had spread to ipsilateral jaw .  2nd urgent care gave her dexamethasone and no abx change.  A week later she spiked a fever at work,  felt terrible,  went to [facility] given cefdinir and 5 day steroid taper starting at 40 mg .   Finally saw [facility] 2 days later,  prescribed Levqaquin for severe inflammation of ear drum  x 14 days (finished yesterday) and repeated a longer prednisone taper .added otic drops (flouroquinolone)  Finally able to pop the ear ,  no pain   Overweight:  lost 40 lbs on optavia diet. Has gained some back due to increased appetite .  Exercising 3 times per week.  Previous intolerance to phentermine due to palpitations.  Wants to try  wegovy  Vertigo; started a few days  before the ear pain ,  aggravated by bending over     Outpatient Medications Prior to Visit  Medication Sig Dispense Refill   albuterol (VENTOLIN HFA) 108 (90 Base) MCG/ACT inhaler Inhale 2 puffs into the lungs  every 6 (six) hours as needed for wheezing or shortness of breath. 8 g 1   cetirizine (ZYRTEC) 10 MG tablet Take 10 mg by mouth daily.     fluticasone (FLONASE) 50 MCG/ACT nasal spray Place 2 sprays into both nostrils daily. 16 g 6   fluticasone (FLONASE) 50 MCG/ACT nasal spray PLACE 2 SPRAYS INTO BOTH NOSTRILS DAILY 16 g 6   ibuprofen (ADVIL,MOTRIN) 800 MG tablet Take 800 mg by mouth 3 (three) times daily.     levothyroxine (SYNTHROID) 100 MCG tablet Take 1 tablet (100 mcg total) by mouth daily. 90 tablet 3   levothyroxine (SYNTHROID, LEVOTHROID) 100 MCG tablet Take 1 tablet (100 mcg total) by mouth daily before breakfast. 90 tablet 4   SYNTHROID 100 MCG tablet TAKE 1 TABLET BY MOUTH ONCE DAILY 90 tablet 3   scopolamine (TRANSDERM-SCOP) 1 MG/3DAYS PLACE 1 PATCH (1.5 MG TOTAL) ONTO THE SKIN EVERY 3 (THREE) DAYS. (Patient not taking: Reported on 01/25/2021) 10 patch 0   scopolamine (TRANSDERM-SCOP, 1.5 MG,) 1 MG/3DAYS Place 1 patch (1.5 mg total) onto the skin every 3 (three) days. (Patient not taking: Reported on 01/25/2021) 10 patch 0   No facility-administered medications prior to visit.    Review of Systems;  Patient denies headache, fevers, malaise, unintentional weight loss, skin rash, eye pain, sinus congestion and sinus pain, sore throat, dysphagia,  hemoptysis , cough, dyspnea, wheezing, chest pain, palpitations,  orthopnea, edema, abdominal pain, nausea, melena, diarrhea, constipation, flank pain, dysuria, hematuria, urinary  Frequency, nocturia, numbness, tingling, seizures,  Focal weakness, Loss of consciousness,  Tremor, insomnia, depression, anxiety, and suicidal ideation.      Objective:  BP 112/82 (BP Location: Left Arm, Patient Position: Sitting)   Pulse (!) 106   Temp (!) 97.2 F (36.2 C)   Ht 5' 10.98" (1.803 m)   Wt 188 lb 9.6 oz (85.5 kg)   SpO2 97%   BMI 26.32 kg/m   BP Readings from Last 3 Encounters:  01/25/21 112/82  06/13/20 108/78  01/11/19 118/70    Wt  Readings from Last 3 Encounters:  01/25/21 188 lb 9.6 oz (85.5 kg)  06/13/20 174 lb 3.2 oz (79 kg)  10/18/19 187 lb (84.8 kg)    General appearance: alert, cooperative and appears stated age Ears: normal right TM and canal;  left TM dull , with resolving erythema , normal canal  Throat: lips, mucosa, and tongue normal; teeth and gums normal Neck: no adenopathy, no carotid bruit, supple, symmetrical, trachea midline and thyroid not enlarged, symmetric, no tenderness/mass/nodules Back: symmetric, no curvature. ROM normal. No CVA tenderness. Lungs: clear to auscultation bilaterally Heart: regular rate and rhythm, S1, S2 normal, no murmur, click, rub or gallop Abdomen: soft, non-tender; bowel sounds normal; no masses,  no organomegaly Pulses: 2+ and symmetric Skin: Skin color, texture, turgor normal. No rashes or lesions Lymph nodes: Cervical, supraclavicular, and axillary nodes normal. Neuro: CNs 2-12 intact.  No nystagmus  . Fine resting tremor bilaterally both hands cerebellar function normal. Romberg negative.  No pronator drift.   Gait normal.    No results found for: HGBA1C  Lab Results  Component Value Date   CREATININE 1.12 06/13/2020   CREATININE 0.97 01/11/2019    Lab Results  Component Value Date   WBC 8.9 06/13/2020   HGB 12.8 06/13/2020   HCT 38.0 06/13/2020   PLT 350.0 06/13/2020   GLUCOSE 82 06/13/2020   CHOL 151 06/16/2014   TRIG 164 06/16/2014   HDL 49 06/16/2014   LDLCALC 69 06/16/2014   ALT 11 06/13/2020   AST 13 06/13/2020   NA 138 06/13/2020   K 3.9 06/13/2020   CL 102 06/13/2020   CREATININE 1.12 06/13/2020   BUN 17 06/13/2020   CO2 27 06/13/2020   TSH 2.01 06/13/2020    MR CERVICAL SPINE WO CONTRAST  Result Date: 10/18/2019 CLINICAL DATA:  Initial evaluation for left upper extremity numbness for 2-3 weeks. EXAM: MRI CERVICAL SPINE WITHOUT CONTRAST TECHNIQUE: Multiplanar, multisequence MR imaging of the cervical spine was performed. No intravenous  contrast was administered. COMPARISON:  Prior CT from 03/25/2017. FINDINGS: Alignment: Straightening of the normal cervical lordosis with trace levoscoliosis. No listhesis. Vertebrae: Susceptibility artifact from prior fusion at C5-6. Vertebral body height maintained without evidence for acute or chronic fracture. Bone marrow signal intensity within normal limits. No discrete or worrisome osseous lesions. No abnormal marrow edema. Cord: Signal intensity within the cervical spinal cord is normal. Posterior Fossa, vertebral arteries, paraspinal tissues: Visualized brain and posterior fossa within normal limits. Craniocervical junction normal. Paraspinous and prevertebral soft tissues within normal limits. Normal intravascular flow voids seen within the vertebral arteries bilaterally. Disc levels: C2-C3: Unremarkable. C3-C4:  Unremarkable. C4-C5: Right eccentric disc bulge with associated right greater than left uncovertebral hypertrophy. Mild flattening of the ventral thecal sac without significant spinal stenosis or cord deformity. Moderate right C5 foraminal stenosis. No significant left foraminal narrowing. C5-C6: Prior fusion.  No residual spinal stenosis. Residual left greater than right uncovertebral hypertrophy with persistent mild to moderate left C6 foraminal stenosis. Right neural foramen is patent. C6-C7: Minimal disc bulge with right-sided uncovertebral hypertrophy. Flattening of the ventral thecal sac without significant spinal stenosis. Mild to moderate right C7 foraminal narrowing. No significant left foraminal stenosis. C7-T1:  Unremarkable. Visualized upper thoracic spine demonstrates no significant finding. IMPRESSION: 1. Prior fusion at C5-6 without residual spinal stenosis. Residual left greater than right uncovertebral hypertrophy at this level with resultant mild to moderate left C6 foraminal stenosis. 2. Right eccentric disc bulge and uncovertebral spurring at C4-5 with resultant moderate right  C5 foraminal stenosis. 3. Mild disc bulge with right-sided uncovertebral hypertrophy at C6-7 with resultant mild to moderate right C7 foraminal stenosis. Electronically Signed   By: Rise Mu M.D.   On: 10/18/2019 20:53    Assessment & Plan:   Problem List Items Addressed This Visit       Unprioritized   Overweight (BMI 25.0-29.9)    Reviewed her previous success with the Optavia diet. And phentermine . Agree with trial of wegovy for appetite suppression        Acute otitis media with effusion of left ear    Presumed secondary to pseudomonas or legionella given recent water park exposure and persistence of symptoms with non fluoroquinolone antibiotic coverage.  continue decongestants, steroid nasal spray  .  Refills of abx and steroids put on file In the event that symptoms return        Relevant Medications   levofloxacin (LEVAQUIN) 500 MG tablet   Vertigo, labyrinthine, left    Likely related to her otitis media and sinusitis.  Will reassess in 2-3 weeks        Meds ordered this encounter  Medications   predniSONE (DELTASONE) 10 MG tablet    Sig: 6 tablets on Day 1 , then reduce by 1 tablet daily until gone    Dispense:  21 tablet    Refill:  0   levofloxacin (LEVAQUIN) 500 MG tablet    Sig: Take 1 tablet (500 mg total) by mouth daily for 7 days.    Dispense:  7 tablet    Refill:  0   Semaglutide-Weight Management 0.25 MG/0.5ML SOAJ    Sig: Inject 0.25 mg into the skin once a week for 28 days.    Dispense:  2 mL    Refill:  0   Semaglutide-Weight Management 0.5 MG/0.5ML SOAJ    Sig: Inject 0.5 mg into the skin once a week for 28 days.    Dispense:  2 mL    Refill:  0   Semaglutide-Weight Management 1 MG/0.5ML SOAJ    Sig: Inject 1 mg into the skin once a week for 28 days.    Dispense:  2 mL    Refill:  0   Semaglutide-Weight Management 1.7 MG/0.75ML SOAJ    Sig: Inject 1.7 mg into the skin once a week for 28 days.    Dispense:  3 mL    Refill:  0    Semaglutide-Weight Management 2.4 MG/0.75ML SOAJ    Sig: Inject 2.4 mg into the skin once a week for 28 days.    Dispense:  3 mL    Refill:  0    There are no discontinued medications.  Follow-up: No follow-ups on file.   Sherlene Shams, MD

## 2021-01-27 DIAGNOSIS — H8102 Meniere's disease, left ear: Secondary | ICD-10-CM | POA: Insufficient documentation

## 2021-01-27 DIAGNOSIS — H65192 Other acute nonsuppurative otitis media, left ear: Secondary | ICD-10-CM | POA: Insufficient documentation

## 2021-01-27 NOTE — Assessment & Plan Note (Signed)
Presumed secondary to pseudomonas or legionella given recent water park exposure and persistence of symptoms with non fluoroquinolone antibiotic coverage.  continue decongestants, steroid nasal spray  .  Refills of abx and steroids put on file In the event that symptoms return

## 2021-01-27 NOTE — Assessment & Plan Note (Signed)
Likely related to her otitis media and sinusitis.  Will reassess in 2-3 weeks

## 2021-01-27 NOTE — Assessment & Plan Note (Signed)
Reviewed her previous success with the Optavia diet. And phentermine . Agree with trial of wegovy for appetite suppression

## 2021-02-12 ENCOUNTER — Other Ambulatory Visit: Payer: Self-pay

## 2021-02-12 MED ORDER — SEMAGLUTIDE-WEIGHT MANAGEMENT 0.25 MG/0.5ML ~~LOC~~ SOAJ
0.2500 mg | SUBCUTANEOUS | 0 refills | Status: DC
  Filled 2021-02-12: qty 2, 28d supply, fill #0

## 2021-02-12 MED ORDER — SEMAGLUTIDE-WEIGHT MANAGEMENT 0.25 MG/0.5ML ~~LOC~~ SOAJ: 0.2500 mg | SUBCUTANEOUS | 0 refills | Status: AC

## 2021-02-12 NOTE — Addendum Note (Signed)
Addended by: Sandy Salaam on: 02/12/2021 02:56 PM   Modules accepted: Orders

## 2021-02-12 NOTE — Addendum Note (Signed)
Addended by: Sandy Salaam on: 02/12/2021 03:25 PM   Modules accepted: Orders

## 2021-02-16 NOTE — Telephone Encounter (Signed)
PT called wanting to advise Dr.Tullo or her nurse that all the pharmacist need is a call from someone to advise that it is Ozempic that needs to be filled.

## 2021-04-23 ENCOUNTER — Other Ambulatory Visit: Payer: Self-pay

## 2021-04-23 MED ORDER — SEMAGLUTIDE-WEIGHT MANAGEMENT 1.7 MG/0.75ML ~~LOC~~ SOAJ: 1.7000 mg | SUBCUTANEOUS | 0 refills | Status: DC

## 2021-04-27 MED ORDER — OZEMPIC (2 MG/DOSE) 8 MG/3ML ~~LOC~~ SOPN: 2.0000 mg | PEN_INJECTOR | SUBCUTANEOUS | 1 refills | Status: DC

## 2021-05-03 ENCOUNTER — Telehealth: Payer: Self-pay

## 2021-05-03 NOTE — Telephone Encounter (Signed)
PA for Ozempic 2 mg has been submitted on covermymeds.

## 2021-06-14 ENCOUNTER — Encounter: Payer: Self-pay | Admitting: Internal Medicine

## 2021-06-14 ENCOUNTER — Other Ambulatory Visit: Payer: Self-pay

## 2021-06-14 ENCOUNTER — Ambulatory Visit (INDEPENDENT_AMBULATORY_CARE_PROVIDER_SITE_OTHER): Payer: 59 | Admitting: Internal Medicine

## 2021-06-14 VITALS — BP 110/68 | HR 80 | Temp 98.2°F | Ht 70.0 in | Wt 181.2 lb

## 2021-06-14 DIAGNOSIS — E663 Overweight: Secondary | ICD-10-CM

## 2021-06-14 DIAGNOSIS — Z1231 Encounter for screening mammogram for malignant neoplasm of breast: Secondary | ICD-10-CM

## 2021-06-14 DIAGNOSIS — Z0000 Encounter for general adult medical examination without abnormal findings: Secondary | ICD-10-CM

## 2021-06-14 DIAGNOSIS — F419 Anxiety disorder, unspecified: Secondary | ICD-10-CM | POA: Diagnosis not present

## 2021-06-14 DIAGNOSIS — E039 Hypothyroidism, unspecified: Secondary | ICD-10-CM

## 2021-06-14 LAB — LIPID PANEL
Cholesterol: 195 mg/dL (ref 0–200)
HDL: 73.9 mg/dL (ref >39.00)
LDL Cholesterol: 100 mg/dL — ABNORMAL HIGH (ref 0–99)
NonHDL: 120.8
Total CHOL/HDL Ratio: 3
Triglycerides: 102 mg/dL (ref 0.0–149.0)
VLDL: 20.4 mg/dL (ref 0.0–40.0)

## 2021-06-14 LAB — COMPREHENSIVE METABOLIC PANEL
ALT: 13 U/L (ref 0–35)
AST: 13 U/L (ref 0–37)
Albumin: 4.5 g/dL (ref 3.5–5.2)
Alkaline Phosphatase: 43 U/L (ref 39–117)
BUN: 10 mg/dL (ref 6–23)
CO2: 27 mEq/L (ref 19–32)
Calcium: 9.6 mg/dL (ref 8.4–10.5)
Chloride: 102 mEq/L (ref 96–112)
Creatinine, Ser: 0.91 mg/dL (ref 0.40–1.20)
GFR: 79.56 mL/min (ref >60.00)
Glucose, Bld: 88 mg/dL (ref 70–99)
Potassium: 4.2 mEq/L (ref 3.5–5.1)
Sodium: 138 mEq/L (ref 135–145)
Total Bilirubin: 0.6 mg/dL (ref 0.2–1.2)
Total Protein: 6.8 g/dL (ref 6.0–8.3)

## 2021-06-14 LAB — TSH: TSH: 2.32 u[IU]/mL (ref 0.35–5.50)

## 2021-06-14 MED ORDER — SEMAGLUTIDE (2 MG/DOSE) 8 MG/3ML ~~LOC~~ SOPN: 2.0000 mg | PEN_INJECTOR | SUBCUTANEOUS | 0 refills | Status: DC

## 2021-06-14 NOTE — Patient Instructions (Addendum)
Start back on ozempic at the lowest dose to avoid nausea.  Advance every 2 weeks of tolerated    EXERCISE IS IMPORTANT!  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   Your annual mammogram has been ordered.  Please call  [facility] to make your appointment (they will not allow [place] to schedule it for you,  [facility]  [phone]
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
## 2021-06-14 NOTE — Assessment & Plan Note (Signed)
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
## 2021-06-14 NOTE — Assessment & Plan Note (Signed)
Managed with levothyroxine.  By [facility] Sheria Lang. Thyroid function is normal   Lab Results  Component Value Date   TSH 2.32 06/14/2021

## 2021-06-14 NOTE — Assessment & Plan Note (Signed)
RESUMING ozempic with  2 mg (Gold)  sample pen and conversion  Given.  Advised to start back at 0.25 mg dose and advance every 2 weeks as needed and tolerated

## 2021-06-18 ENCOUNTER — Encounter: Payer: Self-pay | Admitting: Internal Medicine

## 2021-06-18 ENCOUNTER — Telehealth: Payer: Self-pay

## 2021-06-18 NOTE — Telephone Encounter (Signed)
Placed in quick sign folder for signature.  

## 2021-06-18 NOTE — Telephone Encounter (Signed)
Hey Dr Darrick Huntsman I have a form that apparently I need filled out for my assignment starting in [place] is it possible to have it filled and emailed back to me? [email or web address]  Thank you so much for everything and Christmas!!!  Attachments  Physical Form Updated 2020.pdf

## 2021-06-18 NOTE — Telephone Encounter (Signed)
Sharon Weber  P [facility] (supporting Sherlene Shams, MD) 4 minutes ago (11:57 AM)   I can pick up     You  Sharon Weber 5 minutes ago (11:56 AM)   I have forwarded the paperwork, but unfortunately we can not e-mail the paperwork back. We can only fax, mail, or place up front for pick up.    Sharon Weber  P [facility] (supporting Sherlene Shams, MD) 56 minutes ago (11:05 AM)   Hey Dr Darrick Huntsman I have a form that apparently I need filled out for my assignment starting in [place] is it possible to have it filled and emailed back to me? [email or web address]  Thank you so much for everything and Christmas!!!  Attachments  Physical Form Updated 2020.pdf

## 2021-08-16 ENCOUNTER — Encounter: Payer: Self-pay | Admitting: Internal Medicine

## 2021-08-16 ENCOUNTER — Other Ambulatory Visit: Payer: Self-pay | Admitting: Internal Medicine

## 2021-08-16 DIAGNOSIS — E663 Overweight: Secondary | ICD-10-CM

## 2021-08-16 MED ORDER — SEMAGLUTIDE (2 MG/DOSE) 8 MG/3ML ~~LOC~~ SOPN: 2.0000 mg | PEN_INJECTOR | SUBCUTANEOUS | 0 refills | Status: DC

## 2021-08-17 MED ORDER — SEMAGLUTIDE (2 MG/DOSE) 8 MG/3ML ~~LOC~~ SOPN: 2.0000 mg | PEN_INJECTOR | SUBCUTANEOUS | 0 refills | Status: DC

## 2021-08-23 ENCOUNTER — Other Ambulatory Visit: Payer: Self-pay

## 2021-08-27 MED ORDER — ONDANSETRON HCL 4 MG PO TABS: 4.0000 mg | ORAL_TABLET | Freq: Three times a day (TID) | ORAL | 0 refills | Status: DC | PRN

## 2021-08-27 MED ORDER — WEGOVY 2.4 MG/0.75ML ~~LOC~~ SOAJ: 2.4000 mg | SUBCUTANEOUS | 2 refills | Status: DC

## 2021-08-27 NOTE — Addendum Note (Signed)
Addended by: Sherlene Shams on: 08/27/2021 04:24 PM   Modules accepted: Orders

## 2021-12-15 ENCOUNTER — Other Ambulatory Visit: Payer: Self-pay | Admitting: Internal Medicine

## 2021-12-27 ENCOUNTER — Telehealth: Payer: Self-pay | Admitting: Internal Medicine

## 2021-12-27 NOTE — Telephone Encounter (Signed)
Spoke with pt to let her know that we do not have record of her immunizations and they are not in the [employer]. Pt stated that she would get them and send it to [place] through her mychart. Form has been placed in red folder.

## 2021-12-27 NOTE — Telephone Encounter (Signed)
Patient dropped off a health form to be completed by Dr Darrick Huntsman. Forms are up front in [place]'s color folder.

## 2022-01-07 ENCOUNTER — Encounter: Payer: Self-pay | Admitting: Internal Medicine

## 2022-01-09 NOTE — Telephone Encounter (Signed)
printed

## 2022-01-10 DIAGNOSIS — Z0279 Encounter for issue of other medical certificate: Secondary | ICD-10-CM

## 2022-03-08 ENCOUNTER — Ambulatory Visit
Admission: RE | Admit: 2022-03-08 | Discharge: 2022-03-08 | Disposition: A | Discharge status: Home / Self Care | Payer: BC Managed Care – PPO | Source: Ambulatory Visit | Attending: Internal Medicine | Admitting: Internal Medicine

## 2022-03-08 DIAGNOSIS — Z1231 Encounter for screening mammogram for malignant neoplasm of breast: Secondary | ICD-10-CM | POA: Insufficient documentation

## 2022-06-12 NOTE — Telephone Encounter (Signed)
Abstraction  

## 2022-06-17 ENCOUNTER — Ambulatory Visit: Payer: 59 | Admitting: Internal Medicine

## 2022-06-28 ENCOUNTER — Encounter: Payer: Self-pay | Admitting: Internal Medicine

## 2022-06-28 ENCOUNTER — Ambulatory Visit (INDEPENDENT_AMBULATORY_CARE_PROVIDER_SITE_OTHER): Payer: BC Managed Care – PPO | Admitting: Internal Medicine

## 2022-06-28 VITALS — BP 104/66 | HR 83 | Temp 97.6°F | Ht 70.0 in | Wt 165.0 lb

## 2022-06-28 DIAGNOSIS — Z79899 Other long term (current) drug therapy: Secondary | ICD-10-CM

## 2022-06-28 DIAGNOSIS — E039 Hypothyroidism, unspecified: Secondary | ICD-10-CM

## 2022-06-28 DIAGNOSIS — E785 Hyperlipidemia, unspecified: Secondary | ICD-10-CM | POA: Diagnosis not present

## 2022-06-28 DIAGNOSIS — Z0000 Encounter for general adult medical examination without abnormal findings: Secondary | ICD-10-CM

## 2022-06-28 LAB — COMPREHENSIVE METABOLIC PANEL
ALT: 29 U/L (ref 0–35)
AST: 26 U/L (ref 0–37)
Albumin: 4.5 g/dL (ref 3.5–5.2)
Alkaline Phosphatase: 35 U/L — ABNORMAL LOW (ref 39–117)
BUN: 15 mg/dL (ref 6–23)
CO2: 28 mEq/L (ref 19–32)
Calcium: 9.5 mg/dL (ref 8.4–10.5)
Chloride: 104 mEq/L (ref 96–112)
Creatinine, Ser: 0.83 mg/dL (ref 0.40–1.20)
GFR: 88.2 mL/min (ref >60.00)
Glucose, Bld: 92 mg/dL (ref 70–99)
Potassium: 4.3 mEq/L (ref 3.5–5.1)
Sodium: 139 mEq/L (ref 135–145)
Total Bilirubin: 0.3 mg/dL (ref 0.2–1.2)
Total Protein: 6.8 g/dL (ref 6.0–8.3)

## 2022-06-28 LAB — LIPID PANEL
Cholesterol: 226 mg/dL — ABNORMAL HIGH (ref 0–200)
HDL: 78.8 mg/dL (ref >39.00)
LDL Cholesterol: 123 mg/dL — ABNORMAL HIGH (ref 0–99)
NonHDL: 147.61
Total CHOL/HDL Ratio: 3
Triglycerides: 121 mg/dL (ref 0.0–149.0)
VLDL: 24.2 mg/dL (ref 0.0–40.0)

## 2022-06-28 LAB — TSH: TSH: 1.98 u[IU]/mL (ref 0.35–5.50)

## 2022-06-28 LAB — LDL CHOLESTEROL, DIRECT: Direct LDL: 110 mg/dL

## 2022-06-28 LAB — HEMOGLOBIN A1C: Hgb A1c MFr Bld: 5.6 % (ref 4.6–6.5)

## 2022-06-28 NOTE — Progress Notes (Unsigned)
Patient ID: Sharon Weber, female    DOB: [birth year]  Age: 41 y.o. MRN: [id number]  The patient is here for annual preventive examination and management of other chronic and acute problems.   The risk factors are reflected in the social history.   The roster of all physicians providing medical care to patient - is listed in the Snapshot section of the chart.   Activities of daily living:  The patient is 100% independent in all ADLs: dressing, toileting, feeding as well as independent mobility   Home safety : The patient has smoke detectors in the home. They wear seatbelts.  There are no unsecured firearms at home. There is no violence in the home.    There is no risks for hepatitis, STDs or HIV. There is no   history of blood transfusion. They have no travel history to infectious disease endemic areas of the world.   The patient has seen their dentist in the last six month. They have seen their eye doctor in the last year. The patinet  denies slight hearing difficulty with regard to whispered voices and some television programs.  They have deferred audiologic testing in the last year.  They do not  have excessive sun exposure. Discussed the need for sun protection: hats, long sleeves and use of sunscreen if there is significant sun exposure.    Diet: the importance of a healthy diet is discussed. They do have a healthy diet.  SHE HAS lost 35 lbs intentionally    The benefits of regular aerobic exercise were discussed. The patient  is not exercising =regularly yet     Depression screen: there are no signs or vegative symptoms of depression- irritability, change in appetite, anhedonia, sadness/tearfullness.   The following portions of the patient's history were reviewed and updated as appropriate: allergies, current medications, past family history, past medical history,  past surgical history, past social history  and problem list.   Visual acuity was not assessed per patient preference since  the patient has regular follow up with an  ophthalmologist. Hearing and body mass index were assessed and reviewed.    During the course of the visit the patient was educated and counseled about appropriate screening and preventive services including : fall prevention , diabetes screening, nutrition counseling, colorectal cancer screening, and recommended immunizations.    Chief Complaint:  HPI     Annual Exam    Additional comments: Physical       Last edited by Sandy Salaam, CMA on 06/28/2022  8:38 AM.        Review of Symptoms  Patient denies headache, fevers, malaise, unintentional weight loss, skin rash, eye pain, sinus congestion and sinus pain, sore throat, dysphagia,  hemoptysis , cough, dyspnea, wheezing, chest pain, palpitations, orthopnea, edema, abdominal pain, nausea, melena, diarrhea, constipation, flank pain, dysuria, hematuria, urinary  Frequency, nocturia, numbness, tingling, seizures,  Focal weakness, Loss of consciousness,  Tremor, insomnia, depression, anxiety, and suicidal ideation.    Physical Exam:  BP 104/66   Pulse 83   Temp 97.6 F (36.4 C) (Oral)   Ht 5\' 10"  (1.778 m)   Wt 165 lb (74.8 kg)   SpO2 98%   BMI 23.68 kg/m    Physical Exam  Assessment and Plan: Acquired hypothyroidism  Encounter for preventive health examination  Dyslipidemia  Long-term use of high-risk medication    No follow-ups on file.  , MD

## 2022-06-30 NOTE — Assessment & Plan Note (Signed)

## 2022-07-02 ENCOUNTER — Telehealth: Payer: BC Managed Care – PPO | Admitting: Physician Assistant

## 2022-07-02 DIAGNOSIS — R3989 Other symptoms and signs involving the genitourinary system: Secondary | ICD-10-CM

## 2022-07-02 MED ORDER — PHENAZOPYRIDINE HCL 100 MG PO TABS: 100.0000 mg | ORAL_TABLET | Freq: Three times a day (TID) | ORAL | 0 refills | Status: DC | PRN

## 2022-07-02 MED ORDER — CEPHALEXIN 500 MG PO CAPS: 500.0000 mg | ORAL_CAPSULE | Freq: Two times a day (BID) | ORAL | 0 refills | Status: AC

## 2022-07-02 NOTE — Progress Notes (Signed)

## 2022-07-02 NOTE — Progress Notes (Signed)
I have spent 5 minutes in review of e-visit questionnaire, review and updating patient chart, medical decision making and response to patient.   Dilyn Smiles Cody Nikoli Nasser, PA-C    

## 2022-07-02 NOTE — Addendum Note (Signed)
Addended by: Brunetta Jeans on: 07/02/2022 07:42 AM   Modules accepted: Orders

## 2022-10-04 ENCOUNTER — Telehealth: Payer: Self-pay | Admitting: Internal Medicine

## 2022-10-04 NOTE — Telephone Encounter (Signed)
Patient dropped off document  Physicians Examination , to be filled out by provider. Patient requested to send it via Call Patient to pick up within 7-days. Document is located in providers tray at front office.Please advise at Mobile [phone] (mobile)

## 2022-10-07 ENCOUNTER — Encounter: Payer: Self-pay | Admitting: Internal Medicine

## 2022-10-07 NOTE — Telephone Encounter (Signed)
I have placed form in red folder, does pt need to be seen to have completed?

## 2022-10-07 NOTE — Telephone Encounter (Signed)
I can't pull up her immunizations

## 2022-10-07 NOTE — Telephone Encounter (Signed)
LMTCB

## 2022-10-08 NOTE — Telephone Encounter (Signed)
Pt called back and I read the message to her and she stated she probably had it as a child and what to know does it still count

## 2022-10-08 NOTE — Telephone Encounter (Signed)
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 Does she still need to have another meningitis vaccine?

## 2022-10-10 DIAGNOSIS — Z0279 Encounter for issue of other medical certificate: Secondary | ICD-10-CM

## 2022-10-22 ENCOUNTER — Other Ambulatory Visit: Payer: Self-pay

## 2022-10-22 ENCOUNTER — Other Ambulatory Visit: Payer: Self-pay | Admitting: Internal Medicine

## 2022-10-22 MED ORDER — CETIRIZINE HCL 10 MG PO TABS
10.0000 mg | ORAL_TABLET | Freq: Every day | ORAL | 1 refills | Status: AC
  Filled 2022-10-22 – 2023-01-23 (×2): qty 90, 90d supply, fill #0

## 2022-10-24 MED ORDER — FLUTICASONE PROPIONATE 50 MCG/ACT NA SUSP: 2.0000 | Freq: Every day | NASAL | 6 refills | Status: AC

## 2022-10-24 NOTE — Addendum Note (Signed)
Addended by: Sherlene Shams on: 10/24/2022 07:38 PM   Modules accepted: Orders

## 2023-01-23 ENCOUNTER — Other Ambulatory Visit: Payer: Self-pay

## 2023-01-28 ENCOUNTER — Other Ambulatory Visit: Payer: Self-pay

## 2023-01-28 MED ORDER — LEVOTHYROXINE SODIUM 100 MCG PO TABS: 100.0000 ug | ORAL_TABLET | Freq: Every day | ORAL | 0 refills | Status: DC

## 2023-01-29 ENCOUNTER — Other Ambulatory Visit: Payer: Self-pay | Admitting: Internal Medicine

## 2023-01-29 ENCOUNTER — Encounter: Payer: Self-pay | Admitting: Internal Medicine

## 2023-01-29 DIAGNOSIS — Z1231 Encounter for screening mammogram for malignant neoplasm of breast: Secondary | ICD-10-CM

## 2023-01-29 NOTE — Telephone Encounter (Signed)
Pt called in looking for an appt for her UTI. Pt its book to see Sharon Croon NP tomorrow 01/30/23.

## 2023-01-30 ENCOUNTER — Encounter: Payer: Self-pay | Admitting: Nurse Practitioner

## 2023-01-30 ENCOUNTER — Ambulatory Visit: Payer: BC Managed Care – PPO | Admitting: Nurse Practitioner

## 2023-01-30 VITALS — BP 118/78 | HR 85 | Temp 98.4°F | Ht 70.0 in | Wt 178.2 lb

## 2023-01-30 DIAGNOSIS — R300 Dysuria: Secondary | ICD-10-CM | POA: Insufficient documentation

## 2023-01-30 LAB — POCT URINALYSIS DIPSTICK
Bilirubin, UA: NEGATIVE
Glucose, UA: NEGATIVE
Ketones, UA: NEGATIVE
Leukocytes, UA: NEGATIVE
Nitrite, UA: NEGATIVE
Protein, UA: NEGATIVE
Spec Grav, UA: >=1.03 — AB (ref 1.010–1.025)
Urobilinogen, UA: 0.2 E.U./dL
pH, UA: 5.5 (ref 5.0–8.0)

## 2023-01-30 MED ORDER — CEPHALEXIN 500 MG PO CAPS: 500.0000 mg | ORAL_CAPSULE | Freq: Two times a day (BID) | ORAL | 0 refills | Status: AC

## 2023-01-30 MED ORDER — PHENAZOPYRIDINE HCL 200 MG PO TABS: 200.0000 mg | ORAL_TABLET | Freq: Three times a day (TID) | ORAL | 0 refills | Status: DC | PRN

## 2023-01-30 NOTE — Progress Notes (Signed)
Established Patient Office Visit  Subjective:  Patient ID: Sharon Weber, female    DOB: [birth year]  Age: 42 y.o. MRN: [id number]  CC:  Chief Complaint  Patient presents with   Urinary Tract Infection    HPI  Sharon Weber presents for:  Dysuria  This is a new problem. The current episode started in the past 7 days (tuesady afternon). The problem occurs every urination. The problem has been gradually worsening. Quality: Uneasy sensation. Associated symptoms include frequency and hesitancy. Pertinent negatives include no chills. Associated symptoms comments: On her cycles: started yesterday. . Treatments tried: AZO. The treatment provided mild relief.     Past Medical History:  Diagnosis Date   Respiratory tract infection due to COVID-19 virus 04/25/2019   Thyroid disease     Past Surgical History:  Procedure Laterality Date   BREAST BIOPSY Left 05/02/2009   CHOLECYSTECTOMY  2007    Family History  Problem Relation Age of Onset   Arthritis Mother    Asthma Mother    COPD Mother    Miscarriages / [place] Mother    Stroke Mother    Diabetes Father    Hyperlipidemia Father    Hypertension Father    Diabetes Sister    Early death Maternal Grandmother    Heart attack Maternal Grandmother    Cancer Maternal Grandfather    Cancer Paternal Grandmother    Alcohol abuse Paternal Grandfather    Diabetes Paternal Grandfather    Early death Paternal Grandfather    Heart attack Paternal Grandfather    Learning disabilities Sister    Breast cancer Maternal Aunt 70    Social History   Socioeconomic History   Marital status: Married    Spouse name: Not on file   Number of children: Not on file   Years of education: Not on file   Highest education level: Not on file  Occupational History   Not on file  Tobacco Use   Smoking status: Never   Smokeless tobacco: Never  Vaping Use   Vaping status: Never Used  Substance and Sexual Activity   Alcohol use: Yes     Alcohol/week: 0.0 standard drinks of alcohol    Comment: occ   Drug use: No   Sexual activity: Yes    Birth control/protection: None  Other Topics Concern   Not on file  Social History Narrative   Not on file   Social Determinants of Health   Financial Resource Strain: Not on file  Food Insecurity: Not on file  Transportation Needs: Not on file  Physical Activity: Not on file  Stress: Not on file  Social Connections: Not on file  Intimate Partner Violence: Not on file     Outpatient Medications Prior to Visit  Medication Sig Dispense Refill   cetirizine (ZYRTEC) 10 MG tablet Take 1 tablet (10 mg total) by mouth daily. 90 tablet 1   fluticasone (FLONASE) 50 MCG/ACT nasal spray Place 2 sprays into both nostrils daily. 16 g 6   ibuprofen (ADVIL,MOTRIN) 800 MG tablet Take 800 mg by mouth 3 (three) times daily.     levothyroxine (SYNTHROID) 100 MCG tablet Take 1 tablet (100 mcg total) by mouth daily. 90 tablet 0   phenazopyridine (PYRIDIUM) 100 MG tablet Take 1 tablet (100 mg total) by mouth 3 (three) times daily as needed for pain. 10 tablet 0   No facility-administered medications prior to visit.    Allergies  Allergen Reactions   Azithromycin Swelling and Other (  See Comments)    Water blister Water blister Water blister Water blister    ROS Review of Systems  Constitutional:  Negative for chills.  Genitourinary:  Positive for dysuria, frequency and hesitancy.   Negative unless indicated in HPI.    Objective:    Physical Exam Constitutional:      Appearance: Normal appearance.  Cardiovascular:     Rate and Rhythm: Normal rate and regular rhythm.     Pulses: Normal pulses.     Heart sounds: Normal heart sounds.  Abdominal:     General: Bowel sounds are normal.     Palpations: Abdomen is soft.     Tenderness: There is no abdominal tenderness. There is no right CVA tenderness or left CVA tenderness.  Musculoskeletal:     Cervical back: Normal range of motion.   Neurological:     General: No focal deficit present.     Mental Status: She is alert. Mental status is at baseline.  Psychiatric:        Mood and Affect: Mood normal.        Behavior: Behavior normal.        Thought Content: Thought content normal.        Judgment: Judgment normal.     BP 118/78   Pulse 85   Temp 98.4 F (36.9 C) (Oral)   Ht 5\' 10"  (1.778 m)   Wt 178 lb 3.2 oz (80.8 kg)   SpO2 98%   BMI 25.57 kg/m  Wt Readings from Last 3 Encounters:  01/30/23 178 lb 3.2 oz (80.8 kg)  06/28/22 165 lb (74.8 kg)  06/14/21 181 lb 3.2 oz (82.2 kg)     Health Maintenance  Topic Date Due   COVID-19 Vaccine (3 - Pfizer risk series) 08/24/2019   INFLUENZA VACCINE  09/29/2023 (Originally 01/30/2023)   PAP SMEAR-Modifier  07/27/2023   DTaP/Tdap/Td (2 - Td or Tdap) 06/13/2030   Hepatitis C Screening  Completed   HIV Screening  Completed   HPV VACCINES  Aged Out    There are no preventive care reminders to display for this patient.  Lab Results  Component Value Date   TSH 1.98 06/28/2022   Lab Results  Component Value Date   WBC 8.9 06/13/2020   HGB 12.8 06/13/2020   HCT 38.0 06/13/2020   MCV 86.8 06/13/2020   PLT 350.0 06/13/2020   Lab Results  Component Value Date   NA 139 06/28/2022   K 4.3 06/28/2022   CO2 28 06/28/2022   GLUCOSE 92 06/28/2022   BUN 15 06/28/2022   CREATININE 0.83 06/28/2022   BILITOT 0.3 06/28/2022   ALKPHOS 35 (L) 06/28/2022   AST 26 06/28/2022   ALT 29 06/28/2022   PROT 6.8 06/28/2022   ALBUMIN 4.5 06/28/2022   CALCIUM 9.5 06/28/2022   GFR 88.20 06/28/2022   Lab Results  Component Value Date   CHOL 226 (H) 06/28/2022   Lab Results  Component Value Date   HDL 78.80 06/28/2022   Lab Results  Component Value Date   LDLCALC 123 (H) 06/28/2022   Lab Results  Component Value Date   TRIG 121.0 06/28/2022   Lab Results  Component Value Date   CHOLHDL 3 06/28/2022   Lab Results  Component Value Date   HGBA1C 5.6 06/28/2022       Assessment & Plan:  Dysuria Assessment & Plan: POCT urinalysis positive for blood, she is on her cycles. Will treat with keflex 500 mg twice a day for  5 days and pyridium.  Advised patient to increase fluid intake. UA and microscopy pending.    Orders: -     POCT urinalysis dipstick -     UA/M w/rflx Culture, Routine  Other orders -     Phenazopyridine HCl; Take 1 tablet (200 mg total) by mouth 3 (three) times daily as needed for pain.  Dispense: 10 tablet; Refill: 0 -     Cephalexin; Take 1 capsule (500 mg total) by mouth 2 (two) times daily for 5 days.  Dispense: 10 capsule; Refill: 0    Follow-up: Return if symptoms worsen or fail to improve.   Kara Dies, NP

## 2023-01-30 NOTE — Telephone Encounter (Signed)
Noted  

## 2023-01-30 NOTE — Assessment & Plan Note (Signed)
POCT urinalysis positive for blood, she is on her cycles. Will treat with keflex 500 mg twice a day for 5 days and pyridium.  Advised patient to increase fluid intake. UA and microscopy pending.

## 2023-02-02 ENCOUNTER — Encounter: Payer: Self-pay | Admitting: Nurse Practitioner

## 2023-03-10 ENCOUNTER — Ambulatory Visit
Admission: RE | Admit: 2023-03-10 | Discharge: 2023-03-10 | Disposition: A | Discharge status: Home / Self Care | Payer: BC Managed Care – PPO | Source: Ambulatory Visit | Attending: Internal Medicine | Admitting: Internal Medicine

## 2023-03-10 DIAGNOSIS — Z1231 Encounter for screening mammogram for malignant neoplasm of breast: Secondary | ICD-10-CM | POA: Diagnosis present

## 2023-04-16 ENCOUNTER — Telehealth: Payer: BC Managed Care – PPO | Admitting: Physician Assistant

## 2023-04-16 DIAGNOSIS — R3989 Other symptoms and signs involving the genitourinary system: Secondary | ICD-10-CM | POA: Diagnosis not present

## 2023-04-16 MED ORDER — SULFAMETHOXAZOLE-TRIMETHOPRIM 800-160 MG PO TABS
1.0000 | ORAL_TABLET | Freq: Two times a day (BID) | ORAL | 0 refills | Status: DC
Start: 2023-04-16 — End: 2023-07-04

## 2023-04-16 NOTE — Progress Notes (Signed)

## 2023-04-16 NOTE — Progress Notes (Signed)
I have spent 5 minutes in review of e-visit questionnaire, review and updating patient chart, medical decision making and response to patient.   Mia Milan Cody Jacklynn Dehaas, PA-C    

## 2023-05-07 ENCOUNTER — Other Ambulatory Visit (HOSPITAL_BASED_OUTPATIENT_CLINIC_OR_DEPARTMENT_OTHER): Payer: Self-pay

## 2023-05-16 ENCOUNTER — Other Ambulatory Visit: Payer: Self-pay | Admitting: Internal Medicine

## 2023-06-09 ENCOUNTER — Encounter: Payer: Self-pay | Admitting: Internal Medicine

## 2023-06-10 ENCOUNTER — Emergency Department: Payer: BC Managed Care – PPO

## 2023-06-10 ENCOUNTER — Emergency Department
Admission: EM | Admit: 2023-06-10 | Discharge: 2023-06-10 | Disposition: A | Discharge status: Home / Self Care | Payer: BC Managed Care – PPO | Attending: Emergency Medicine | Admitting: Emergency Medicine

## 2023-06-10 ENCOUNTER — Other Ambulatory Visit: Payer: Self-pay

## 2023-06-10 DIAGNOSIS — I10 Essential (primary) hypertension: Secondary | ICD-10-CM | POA: Insufficient documentation

## 2023-06-10 DIAGNOSIS — R519 Headache, unspecified: Secondary | ICD-10-CM | POA: Insufficient documentation

## 2023-06-10 DIAGNOSIS — Z79899 Other long term (current) drug therapy: Secondary | ICD-10-CM | POA: Insufficient documentation

## 2023-06-10 DIAGNOSIS — M542 Cervicalgia: Secondary | ICD-10-CM | POA: Insufficient documentation

## 2023-06-10 DIAGNOSIS — R479 Unspecified speech disturbances: Secondary | ICD-10-CM | POA: Diagnosis not present

## 2023-06-10 LAB — URINALYSIS, ROUTINE W REFLEX MICROSCOPIC
Bilirubin Urine: NEGATIVE
Glucose, UA: NEGATIVE mg/dL
Ketones, ur: NEGATIVE mg/dL
Nitrite: NEGATIVE
Protein, ur: NEGATIVE mg/dL
Specific Gravity, Urine: 1.009 (ref 1.005–1.030)
pH: 5 (ref 5.0–8.0)

## 2023-06-10 LAB — CBC
HCT: 39.8 % (ref 36.0–46.0)
Hemoglobin: 13.3 g/dL (ref 12.0–15.0)
MCH: 29.3 pg (ref 26.0–34.0)
MCHC: 33.4 g/dL (ref 30.0–36.0)
MCV: 87.7 fL (ref 80.0–100.0)
Platelets: 355 10*3/uL (ref 150–400)
RBC: 4.54 MIL/uL (ref 3.87–5.11)
RDW: 13.2 % (ref 11.5–15.5)
WBC: 7.6 10*3/uL (ref 4.0–10.5)
nRBC: 0 % (ref 0.0–0.2)

## 2023-06-10 LAB — COMPREHENSIVE METABOLIC PANEL
ALT: 15 U/L (ref 0–44)
AST: 16 U/L (ref 15–41)
Albumin: 4.6 g/dL (ref 3.5–5.0)
Alkaline Phosphatase: 44 U/L (ref 38–126)
Anion gap: 9 (ref 5–15)
BUN: 14 mg/dL (ref 6–20)
CO2: 22 mmol/L (ref 22–32)
Calcium: 9.1 mg/dL (ref 8.9–10.3)
Chloride: 105 mmol/L (ref 98–111)
Creatinine, Ser: 0.93 mg/dL (ref 0.44–1.00)
GFR, Estimated: >60 mL/min (ref >60)
Glucose, Bld: 98 mg/dL (ref 70–99)
Potassium: 3.6 mmol/L (ref 3.5–5.1)
Sodium: 136 mmol/L (ref 135–145)
Total Bilirubin: 0.7 mg/dL (ref <1.2)
Total Protein: 7.8 g/dL (ref 6.5–8.1)

## 2023-06-10 LAB — DIFFERENTIAL
Abs Immature Granulocytes: 0.02 10*3/uL (ref 0.00–0.07)
Basophils Absolute: 0.1 10*3/uL (ref 0.0–0.1)
Basophils Relative: 1 %
Eosinophils Absolute: 0.2 10*3/uL (ref 0.0–0.5)
Eosinophils Relative: 3 %
Immature Granulocytes: 0 %
Lymphocytes Relative: 47 %
Lymphs Abs: 3.6 10*3/uL (ref 0.7–4.0)
Monocytes Absolute: 0.5 10*3/uL (ref 0.1–1.0)
Monocytes Relative: 6 %
Neutro Abs: 3.2 10*3/uL (ref 1.7–7.7)
Neutrophils Relative %: 43 %

## 2023-06-10 LAB — PROTIME-INR
INR: 1 (ref 0.8–1.2)
Prothrombin Time: 12.9 s (ref 11.4–15.2)

## 2023-06-10 LAB — URINE DRUG SCREEN, QUALITATIVE (ARMC ONLY)
Amphetamines, Ur Screen: NOT DETECTED
Barbiturates, Ur Screen: NOT DETECTED
Benzodiazepine, Ur Scrn: NOT DETECTED
Cannabinoid 50 Ng, Ur ~~LOC~~: NOT DETECTED
Cocaine Metabolite,Ur ~~LOC~~: NOT DETECTED
MDMA (Ecstasy)Ur Screen: NOT DETECTED
Methadone Scn, Ur: NOT DETECTED
Opiate, Ur Screen: NOT DETECTED
Phencyclidine (PCP) Ur S: NOT DETECTED
Tricyclic, Ur Screen: NOT DETECTED

## 2023-06-10 LAB — POC URINE PREG, ED: Preg Test, Ur: NEGATIVE

## 2023-06-10 LAB — T4, FREE: Free T4: 1.26 ng/dL — ABNORMAL HIGH (ref 0.61–1.12)

## 2023-06-10 LAB — TSH: TSH: 2.76 u[IU]/mL (ref 0.350–4.500)

## 2023-06-10 LAB — ETHANOL: Alcohol, Ethyl (B): <10 mg/dL (ref <10)

## 2023-06-10 LAB — APTT: aPTT: 26 s (ref 24–36)

## 2023-06-10 MED ORDER — SODIUM CHLORIDE 0.9 % IV BOLUS
500.0000 mL | Freq: Once | INTRAVENOUS | Status: AC
  Administered 2023-06-10: 500 mL via INTRAVENOUS

## 2023-06-10 MED ORDER — ACETAMINOPHEN 325 MG PO TABS
650.0000 mg | ORAL_TABLET | Freq: Once | ORAL | Status: AC
  Administered 2023-06-10: 650 mg via ORAL
  Filled 2023-06-10: qty 2

## 2023-06-10 MED ORDER — IOHEXOL 350 MG/ML SOLN
75.0000 mL | Freq: Once | INTRAVENOUS | Status: AC | PRN
  Administered 2023-06-10: 75 mL via INTRAVENOUS

## 2023-06-10 MED ORDER — MAGNESIUM SULFATE 2 GM/50ML IV SOLN
2.0000 g | Freq: Once | INTRAVENOUS | Status: AC
  Administered 2023-06-10: 2 g via INTRAVENOUS
  Filled 2023-06-10: qty 50

## 2023-06-10 MED ORDER — PROCHLORPERAZINE EDISYLATE 10 MG/2ML IJ SOLN
10.0000 mg | Freq: Once | INTRAMUSCULAR | Status: AC
  Administered 2023-06-10: 10 mg via INTRAVENOUS
  Filled 2023-06-10: qty 2

## 2023-06-10 NOTE — ED Notes (Signed)
Pt. Returned from MRI 

## 2023-06-10 NOTE — Telephone Encounter (Signed)
Spoke with pt to let her know that we have no appts available here at our office or at [facility] for today. I advised pt that because she had severe pain and an episode of loss of speech and mobility that she should be evaluated at the ED. Pt wanted me to run this by you to make sure that the ED is necessary. She stated that she has not had any more episodes since then but is still having the severe pain and having to take ibuprofen 800 mg every 6 hours.

## 2023-06-10 NOTE — ED Notes (Signed)
Pt. To MRI

## 2023-06-10 NOTE — ED Notes (Signed)
ED Provider at bedside. 

## 2023-06-10 NOTE — ED Triage Notes (Addendum)
Pt. To ED from home for posterior neck pain x3 days with sudden onset of inability to speak, and fall that lasted 3 mins yesterday. Denies LOC with fall, states she does not remember how it happened. Pt. Reports today she is forgetting words, describes it as "brain fog" and is having left lip numbness. 800mg  ibuprofen 1 hour pta.

## 2023-06-10 NOTE — ED Provider Notes (Signed)
[facility] Provider Note    Event Date/Time   First MD Initiated Contact with Patient 06/10/23 1119     (approximate)   History   Neck Pain   HPI  Sharon Weber is a 42 y.o. 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  This resolved.  She has ongoing occipital headache.  She talk to her doctor today who advised to come to the emergency department  She denies any trauma.  No obvious inciting event of her headache.  No drug or alcohol use.  Compliant with all medications.  She denies any motor or sensory changes.  She denies any visual changes.  The headache is in the back of her head and encompasses the left side of her neck as well.    External Medical Documents Reviewed: Samson Frederic phone encounters with her outpatient clinic documenting episode of loss of speech and mobility issues and severe pain prompting advised to come to the emergency department.      Physical Exam   Triage Vital Signs: ED Triage Vitals  Encounter Vitals Group     BP 06/10/23 1115 (!) 134/99     Systolic BP Percentile --      Diastolic BP Percentile --      Pulse Rate 06/10/23 1115 74     Resp 06/10/23 1115 18     Temp 06/10/23 1115 98.3 F (36.8 C)     Temp Source 06/10/23 1115 Oral     SpO2 06/10/23 1115 100 %     Weight 06/10/23 1116 180 lb (81.6 kg)     Height 06/10/23 1116 5\' 10"  (1.778 m)     Head Circumference --      Peak Flow --      Pain Score 06/10/23 1116 4     Pain Loc --      Pain Education --      Exclude from Growth Chart --     Most recent vital signs: Vitals:   06/10/23 1445 06/10/23 1500  BP: 111/68 105/74  Pulse: 68 76  Resp: 13 16  Temp:    SpO2: 100%  100%    General: Awake, no distress.  CV:  Good peripheral perfusion.  Resp:  Normal effort.  Abd:  No distention.  Other:  Awake alert comfortable slightly hypertensive otherwise vital signs normal.  Neck supple with full range of motion.  No facial asymmetry or dysarthria at this time.  Extraocular movements intact.  Motor or sensory intact.  Gait is normal.  Finger-to-nose is normal.   ED Results / Procedures / Treatments   Labs (all labs ordered are listed, but only abnormal results are displayed) Labs Reviewed  T4, FREE - Abnormal; Notable for the following components:      Result Value   Free T4 1.26 (*)    All other components within normal limits  URINALYSIS, ROUTINE W REFLEX MICROSCOPIC - Abnormal; Notable for the following components:   Color, Urine YELLOW (*)    APPearance CLEAR (*)    Hgb urine dipstick SMALL (*)    Leukocytes,Ua MODERATE (*)    Bacteria, UA RARE (*)    All other components within normal limits  TSH  ETHANOL  PROTIME-INR  APTT  CBC  DIFFERENTIAL  COMPREHENSIVE METABOLIC PANEL  URINE DRUG SCREEN, QUALITATIVE ([facility] ONLY)  POC URINE PREG, ED     I ordered and reviewed the above labs they are notable for cell counts within normal limits.  EKG  ED ECG REPORT I, Pilar Jarvis, the attending physician, personally viewed and interpreted this ECG.   Date: 06/10/2023  EKG Time: 1239  Rate: 76  Rhythm: sinus  Axis: nl  Intervals:none  ST&T Change: no stemi    RADIOLOGY I independently reviewed and interpreted CT of the head see no obvious bleeding or midline shift I also reviewed radiologist's formal read.   PROCEDURES:  Critical Care performed: No  Procedures   MEDICATIONS ORDERED IN ED: Medications  prochlorperazine (COMPAZINE) injection 10 mg (10 mg Intravenous Given 06/10/23 1237)  sodium chloride 0.9 % bolus 500 mL (0 mLs Intravenous Stopped 06/10/23 1346)  acetaminophen (TYLENOL) tablet 650 mg (650 mg Oral Given 06/10/23 1234)   magnesium sulfate IVPB 2 g 50 mL (0 g Intravenous Stopped 06/10/23 1341)  iohexol (OMNIPAQUE) 350 MG/ML injection 75 mL (75 mLs Intravenous Contrast Given 06/10/23 1316)     IMPRESSION / MDM / ASSESSMENT AND PLAN / ED COURSE  I reviewed the triage vital signs and the nursing notes.                                Patient's presentation is most consistent with acute presentation with potential threat to life or bodily function.  Differential diagnosis includes, but is not limited to, complex migraine headache, occipital headache, tension type headache, CVA, dissection, head bleed   The patient is on the cardiac monitor to evaluate for evidence of arrhythmia and/or significant heart rate changes.  MDM:    This is a patient with transient neurologic symptoms in the setting of occipital headache/neck pain with no obvious inciting event.  She is well outside of the thrombolytic window with completely resolved neurologic symptoms but ongoing mild headache.  Will get CT angiogram to assess for dissection or stroke.  Followed up with an MRI for any signs of stroke/TIA.  Stroke labs.  Will give migraine cocktail.   -- Fortunately workup unremarkable.  MRI and CT angiogram normal.  Plan for discharge PMD follow-up.       FINAL CLINICAL IMPRESSION(S) / ED DIAGNOSES   Final diagnoses:  Speech problem  Occipital headache     Rx / DC Orders   ED Discharge Orders     None        Note:  This document was prepared using Dragon voice recognition software and may include unintentional dictation errors.    Pilar Jarvis, MD 06/10/23 [phone]

## 2023-06-10 NOTE — ED Notes (Signed)
Triage complete, this RN notified DR. Modesto Charon of pt's symptoms and current condition.

## 2023-06-10 NOTE — Telephone Encounter (Signed)
Spoke with pt and she stated that she is on her way to the ED now.

## 2023-06-10 NOTE — Discharge Instructions (Signed)
Your testing in the emergency department did not show any dangerous conditions that account for your symptoms today.  There was no signs of bleeding in your head, dissection, or stroke.  Take acetaminophen 650 mg and ibuprofen 400 mg every 6 hours for pain.  Take with food. Thank you for choosing [place] for your health care today!  Please see your primary doctor this week for a follow up appointment.   If you have any new, worsening, or unexpected symptoms call your doctor right away or come back to the emergency department for reevaluation.  It was my pleasure to care for you today.   Daneil Dan Modesto Charon, MD

## 2023-06-10 NOTE — ED Notes (Signed)
Dr. Modesto Charon ok'd pt. To be off cardiac monitor for MRI. Pt. To MRI.

## 2023-07-04 ENCOUNTER — Ambulatory Visit (INDEPENDENT_AMBULATORY_CARE_PROVIDER_SITE_OTHER): Payer: 59 | Admitting: Internal Medicine

## 2023-07-04 ENCOUNTER — Encounter: Payer: Self-pay | Admitting: Internal Medicine

## 2023-07-04 ENCOUNTER — Ambulatory Visit: Payer: 59

## 2023-07-04 VITALS — BP 108/70 | HR 82 | Ht 70.0 in | Wt 184.6 lb

## 2023-07-04 DIAGNOSIS — Z1231 Encounter for screening mammogram for malignant neoplasm of breast: Secondary | ICD-10-CM

## 2023-07-04 DIAGNOSIS — E785 Hyperlipidemia, unspecified: Secondary | ICD-10-CM | POA: Diagnosis not present

## 2023-07-04 DIAGNOSIS — Z0001 Encounter for general adult medical examination with abnormal findings: Secondary | ICD-10-CM | POA: Diagnosis not present

## 2023-07-04 DIAGNOSIS — E063 Autoimmune thyroiditis: Secondary | ICD-10-CM | POA: Diagnosis not present

## 2023-07-04 DIAGNOSIS — R519 Headache, unspecified: Secondary | ICD-10-CM | POA: Diagnosis not present

## 2023-07-04 DIAGNOSIS — Z0000 Encounter for general adult medical examination without abnormal findings: Secondary | ICD-10-CM

## 2023-07-04 DIAGNOSIS — R062 Wheezing: Secondary | ICD-10-CM | POA: Diagnosis not present

## 2023-07-04 DIAGNOSIS — E041 Nontoxic single thyroid nodule: Secondary | ICD-10-CM

## 2023-07-04 LAB — COMPREHENSIVE METABOLIC PANEL
ALT: 11 U/L (ref 0–35)
AST: 13 U/L (ref 0–37)
Albumin: 4.6 g/dL (ref 3.5–5.2)
Alkaline Phosphatase: 44 U/L (ref 39–117)
BUN: 20 mg/dL (ref 6–23)
CO2: 23 meq/L (ref 19–32)
Calcium: 9.2 mg/dL (ref 8.4–10.5)
Chloride: 105 meq/L (ref 96–112)
Creatinine, Ser: 0.94 mg/dL (ref 0.40–1.20)
GFR: 75.42 mL/min (ref >60.00)
Glucose, Bld: 105 mg/dL — ABNORMAL HIGH (ref 70–99)
Potassium: 4.6 meq/L (ref 3.5–5.1)
Sodium: 136 meq/L (ref 135–145)
Total Bilirubin: 0.3 mg/dL (ref 0.2–1.2)
Total Protein: 7.3 g/dL (ref 6.0–8.3)

## 2023-07-04 LAB — LIPID PANEL
Cholesterol: 230 mg/dL — ABNORMAL HIGH (ref 0–200)
HDL: 69.4 mg/dL (ref >39.00)
LDL Cholesterol: 136 mg/dL — ABNORMAL HIGH (ref 0–99)
NonHDL: 160.81
Total CHOL/HDL Ratio: 3
Triglycerides: 122 mg/dL (ref 0.0–149.0)
VLDL: 24.4 mg/dL (ref 0.0–40.0)

## 2023-07-04 LAB — TSH: TSH: 2.2 u[IU]/mL (ref 0.35–5.50)

## 2023-07-04 LAB — HEMOGLOBIN A1C: Hgb A1c MFr Bld: 5.8 % (ref 4.6–6.5)

## 2023-07-04 LAB — LDL CHOLESTEROL, DIRECT: Direct LDL: 126 mg/dL

## 2023-07-04 MED ORDER — PREDNISONE 10 MG PO TABS: ORAL_TABLET | ORAL | 0 refills | Status: DC

## 2023-07-04 MED ORDER — LEVOTHYROXINE SODIUM 100 MCG PO TABS: 100.0000 ug | ORAL_TABLET | Freq: Every day | ORAL | 1 refills | Status: DC

## 2023-07-04 NOTE — Patient Instructions (Addendum)
 I have ordered pulmonary function tests to evaluate your lung function and rule out asthma  Prednisone taper: keep on hand for next flare of neck pain

## 2023-07-04 NOTE — Progress Notes (Signed)
 Patient ID: Sharon Weber, female    DOB: [birth year]  Age: 43 y.o. MRN: [id number]  The patient is here for annual preventive examination and management of other chronic and acute problems.   The risk factors are reflected in the social history.   The roster of all physicians providing medical care to patient - is listed in the Snapshot section of the chart.   Activities of daily living:  The patient is 100% independent in all ADLs: dressing, toileting, feeding as well as independent mobility   Home safety : The patient has smoke detectors in the home. They wear seatbelts.  There are no unsecured firearms at home. There is no violence in the home.    There is no risks for hepatitis, STDs or HIV. There is no   history of blood transfusion. They have no travel history to infectious disease endemic areas of the world.   The patient has seen their dentist in the last six month. They have seen their eye doctor in the last year. The patinet  denies slight hearing difficulty with regard to whispered voices and some television programs.  They have deferred audiologic testing in the last year.  They do not  have excessive sun exposure. Discussed the need for sun protection: hats, long sleeves and use of sunscreen if there is significant sun exposure.    Diet: the importance of a healthy diet is discussed. They do have a healthy diet.   The benefits of regular aerobic exercise were discussed. The patient  exercises  3 to 5 days per week  for  60 minutes.    Depression screen: there are no signs or vegative symptoms of depression- irritability, change in appetite, anhedonia, sadness/tearfullness.   The following portions of the patient's history were reviewed and updated as appropriate: allergies, current medications, past family history, past medical history,  past surgical history, past social history  and problem list.   Visual acuity was not assessed per patient preference since the patient has  regular follow up with an  ophthalmologist. Hearing and body mass index were assessed and reviewed.    During the course of the visit the patient was educated and counseled about appropriate screening and preventive services including : fall prevention , diabetes screening, nutrition counseling, colorectal cancer screening, and recommended immunizations.    Chief Complaint:    1) history of severe headache/ ER evaluation :   history of cervical spine surgery remotely,  was directed to ER when she mycharted  signs and symptoms concerning for TIA that occurred at work:  recurrent sharp left sided  occipital headache that lasted several days,  transiently resolved with ibuprofen,  but recurrent, became so severe it  made her dizzy and nauseated when pain was severe, lost her balance at work and developed aphasia during the episode of severe pain hat lasted about 2 minutes,    lip numbness ,  symptoms had  improved but headache had not resolved by time of ER visit . Did not have elevated blood pressure .  Treated for migraine after CTA  brain and MRI were done to rule out dissection, hemorrhage and CVA.  Given magnesium    States that she has been having brain fog   since September  described as forgetfulness and has noted extreme intolerance to cold even in September /October .   2) No prior formal pulmonary evaluation ,  but has  been noting  chest tightness and throat tightness ,  shortness of  breath in the morning that lasts 30-40 minutes since early November.  Has tried using daughter's albuterol   when symptoms were significant,  and found that it alleviated her inspiratory wheezing  .  Does not exercise.  Last documented infection from COVID was in 2020.    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    Physical Exam:  BP 108/70   Pulse 82   Ht 5' 10 (1.778 m)   Wt 184 lb 9.6 oz (83.7 kg)   SpO2 97%   BMI 26.49 kg/m    Physical Exam Vitals reviewed.  Constitutional:      General: She is not in acute distress.    Appearance: Normal appearance. She is normal weight. She is not ill-appearing, toxic-appearing or diaphoretic.  HENT:     Head: Normocephalic.  Eyes:     General: No scleral icterus.       Right eye: No discharge.        Left eye: No discharge.     Conjunctiva/sclera: Conjunctivae normal.  Cardiovascular:     Rate and Rhythm: Normal rate and regular rhythm.     Heart sounds: Normal heart sounds.  Pulmonary:     Effort: Pulmonary effort is normal. No respiratory distress.     Breath sounds: Normal breath sounds.  Musculoskeletal:        General: Normal range of motion.  Skin:    General: Skin is warm and dry.  Neurological:     General: No focal deficit present.     Mental Status: She is alert and oriented to person, place, and time. Mental status is at baseline.  Psychiatric:        Mood and Affect: Mood normal.        Behavior: Behavior normal.        Thought Content: Thought content normal.        Judgment: Judgment normal.    Assessment and Plan: Encounter for general adult medical examination with abnormal findings Assessment & Plan: age appropriate education and counseling updated, referrals for preventative services and immunizations addressed, dietary and smoking counseling addressed, most recent labs reviewed.  I have personally reviewed and have noted:   1) the patient's medical and social history 2) The pt's use of alcohol, tobacco, and illicit drugs 3) The  patient's current medications and supplements 4) Functional ability including ADL's, fall risk, home safety risk, hearing and visual impairment 5) Diet and physical activities 6) Evidence for depression or mood disorderI have made referrals, and provided counseling and education based on review of the above    Dyslipidemia -     Lipid panel -     LDL cholesterol, direct -     Hemoglobin A1c -     Comprehensive metabolic panel  Encounter for screening mammogram for malignant neoplasm of breast -     3D Screening Mammogram, Left and Right; Future  Wheezing Assessment & Plan: Referring for PFTS and pulmonology evaluation  Orders: -     DG Chest 2 View; Future -     Pulmonary Function Test [facility] Only; Future  Hypothyroidism  due to Hashimoto thyroiditis Assessment & Plan: Secondary to Hashimoto's  thyroiditis occurring postpartum,  previously managed By [facility]. Thyroid  function is normal   Lab Results  Component Value Date   TSH 2.20 07/04/2023     Orders: -     TSH -     Thyroglobulin antibody -     Thyroid  peroxidase antibody -     Thyroglobulin Level  Thyroid  nodule Assessment & Plan: Last ultrasound Feb 2021 ;  follow up  was not done in 2023 , but no neck mass was seen on recent CTA neck . SABRA  Ultrasound will  be ordered today   Orders: -     US  THYROID ; Future  Occipital headache Assessment & Plan: Recent episode lasting several days and accompanied by brief period of aphasia lasting < 2 minutes during period of intense pain. .  Reviewed ER workup which included CTA brain and neck as well as MRI brain . Last cervical spine MRI reviewed.   Prednisone  taper for next occurrence.     Other orders -     Levothyroxine  Sodium; Take 1 tablet (100 mcg total) by mouth daily.  Dispense: 90 tablet; Refill: 1 -     predniSONE ; 6 tablets on Day 1 , then reduce by 1 tablet daily until gone  Dispense: 21 tablet; Refill: 0    Return in about 3 months (around  10/02/2023).  Verneita LITTIE Kettering, MD

## 2023-07-05 DIAGNOSIS — R519 Headache, unspecified: Secondary | ICD-10-CM | POA: Insufficient documentation

## 2023-07-05 DIAGNOSIS — R062 Wheezing: Secondary | ICD-10-CM | POA: Insufficient documentation

## 2023-07-05 NOTE — Assessment & Plan Note (Addendum)
Referried for PFTS and pulmonology evaluation.  My review of the Raw data suggests mild reversible miixed obstructive/restrictive   (FEV1/FC is low and improves after albuterol, , TLC and RV are elevated,  but FVC is also low

## 2023-07-05 NOTE — Assessment & Plan Note (Addendum)
 Secondary to Hashimoto's  thyroiditis occurring postpartum,  previously managed By [facility] Endocrinology Sheria Lang. Thyroid function is normal   Lab Results  Component Value Date   TSH 2.20 07/04/2023

## 2023-07-05 NOTE — Assessment & Plan Note (Signed)
 age appropriate education and counseling updated, referrals for preventative services and immunizations addressed, dietary and smoking counseling addressed, most recent labs reviewed.  I have personally reviewed and have noted:   1) the patient's medical and social history 2) The pt's use of alcohol, tobacco, and illicit drugs 3) The patient's current medications and supplements 4) Functional ability including ADL's, fall risk, home safety risk, hearing and visual impairment 5) Diet and physical activities 6) Evidence for depression or mood disorder    I have made referrals, and provided counseling and education based on review of the above

## 2023-07-05 NOTE — Assessment & Plan Note (Addendum)
 Recent episode lasting several days and accompanied by brief period of aphasia lasting < 2 minutes during period of intense pain. .  Reviewed ER workup which included CTA brain and neck as well as MRI brain . Last cervical spine MRI reviewed.   Prednisone  taper for next occurrence.

## 2023-07-05 NOTE — Assessment & Plan Note (Addendum)
 Last ultrasound Feb 2021 ;  follow up  was not done in 2023 , but no neck mass was seen on recent CTA neck . Marland Kitchen  Ultrasound will  be ordered today

## 2023-07-07 ENCOUNTER — Telehealth: Payer: Self-pay | Admitting: Internal Medicine

## 2023-07-07 LAB — THYROGLOBULIN ANTIBODY: Thyroglobulin Ab: 2 [IU]/mL — ABNORMAL HIGH (ref <=1)

## 2023-07-07 LAB — THYROGLOBULIN LEVEL: Thyroglobulin: 15.4 ng/mL

## 2023-07-07 LAB — THYROID PEROXIDASE ANTIBODY: Thyroperoxidase Ab SerPl-aCnc: 327 [IU]/mL — ABNORMAL HIGH (ref <9)

## 2023-07-07 NOTE — Telephone Encounter (Signed)
 Lft pt vm to call ofc to sch [place]. thanks ?

## 2023-07-09 ENCOUNTER — Ambulatory Visit
Admission: RE | Admit: 2023-07-09 | Discharge: 2023-07-09 | Disposition: A | Discharge status: Home / Self Care | Payer: 59 | Source: Ambulatory Visit | Attending: Internal Medicine | Admitting: Internal Medicine

## 2023-07-09 ENCOUNTER — Encounter: Payer: Self-pay | Admitting: Internal Medicine

## 2023-07-09 DIAGNOSIS — E041 Nontoxic single thyroid nodule: Secondary | ICD-10-CM | POA: Insufficient documentation

## 2023-07-11 ENCOUNTER — Encounter: Payer: Self-pay | Admitting: Internal Medicine

## 2023-07-29 ENCOUNTER — Ambulatory Visit: Payer: 59 | Attending: Internal Medicine

## 2023-07-29 DIAGNOSIS — R062 Wheezing: Secondary | ICD-10-CM | POA: Diagnosis present

## 2023-07-29 DIAGNOSIS — F17210 Nicotine dependence, cigarettes, uncomplicated: Secondary | ICD-10-CM | POA: Diagnosis not present

## 2023-07-29 LAB — PULMONARY FUNCTION TEST ARMC ONLY
DL/VA % pred: 95 %
DL/VA: 4.05 ml/min/mmHg/L
DLCO unc % pred: 94 %
DLCO unc: 24.91 ml/min/mmHg
FEF 25-75 Post: 3.58 L/s
FEF 25-75 Pre: 2.62 L/s
FEF2575-%Change-Post: 36 %
FEF2575-%Pred-Post: 103 %
FEF2575-%Pred-Pre: 75 %
FEV1-%Change-Post: 7 %
FEV1-%Pred-Post: 96 %
FEV1-%Pred-Pre: 90 %
FEV1-Post: 3.48 L
FEV1-Pre: 3.24 L
FEV1FVC-%Change-Post: 9 %
FEV1FVC-%Pred-Pre: 91 %
FEV6-%Change-Post: -1 %
FEV6-%Pred-Post: 96 %
FEV6-%Pred-Pre: 98 %
FEV6-Post: 4.23 L
FEV6-Pre: 4.31 L
FEV6FVC-%Pred-Post: 102 %
FEV6FVC-%Pred-Pre: 102 %
FVC-%Change-Post: -1 %
FVC-%Pred-Post: 94 %
FVC-%Pred-Pre: 96 %
FVC-Post: 4.23 L
FVC-Pre: 4.31 L
Post FEV1/FVC ratio: 82 %
Post FEV6/FVC ratio: 100 %
Pre FEV1/FVC ratio: 75 %
Pre FEV6/FVC Ratio: 100 %
RV % pred: 121 %
RV: 2.31 L
TLC % pred: 111 %
TLC: 6.63 L

## 2023-07-29 MED ORDER — ALBUTEROL SULFATE (2.5 MG/3ML) 0.083% IN NEBU
2.5000 mg | INHALATION_SOLUTION | Freq: Once | RESPIRATORY_TRACT | Status: AC
  Administered 2023-07-29: 2.5 mg via RESPIRATORY_TRACT
  Filled 2023-07-29: qty 3

## 2023-08-06 ENCOUNTER — Other Ambulatory Visit: Payer: Self-pay | Admitting: Internal Medicine

## 2023-08-06 ENCOUNTER — Encounter: Payer: Self-pay | Admitting: Internal Medicine

## 2023-08-06 MED ORDER — ALBUTEROL SULFATE HFA 108 (90 BASE) MCG/ACT IN AERS
2.0000 | INHALATION_SPRAY | Freq: Four times a day (QID) | RESPIRATORY_TRACT | 11 refills | Status: AC | PRN
Start: 2023-08-06 — End: ?

## 2023-10-13 ENCOUNTER — Encounter: Payer: Self-pay | Admitting: Internal Medicine

## 2023-10-13 ENCOUNTER — Ambulatory Visit: Payer: 59 | Admitting: Internal Medicine

## 2023-10-13 VITALS — BP 112/66 | HR 97 | Ht 70.0 in | Wt 186.4 lb

## 2023-10-13 DIAGNOSIS — R062 Wheezing: Secondary | ICD-10-CM

## 2023-10-13 DIAGNOSIS — F419 Anxiety disorder, unspecified: Secondary | ICD-10-CM | POA: Diagnosis not present

## 2023-10-13 DIAGNOSIS — E041 Nontoxic single thyroid nodule: Secondary | ICD-10-CM

## 2023-10-13 MED ORDER — BUSPIRONE HCL 10 MG PO TABS: 10.0000 mg | ORAL_TABLET | Freq: Two times a day (BID) | ORAL | 2 refills | Status: AC

## 2023-10-13 NOTE — Assessment & Plan Note (Addendum)
 Last ultrasound Feb 2021 ;  follow up  was not done in 2023 , but no neck mass was seen on recent CTA neck . Aaron Aas  Ultrasound  wa s repeated   in Jan 2025 . Slightly atrophic and maekd heterogenous thyroid with all nodules benign appearing;  no indication for follow up  or biopsy

## 2023-10-13 NOTE — Assessment & Plan Note (Addendum)
 PFTS were done in late January and interpreted by Cleve Dale as "small airway disease., no response to bronchodilator "  Pulmonary referral was offered and accepted but never placed. Referral placed
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
## 2023-10-27 ENCOUNTER — Other Ambulatory Visit
Admission: RE | Admit: 2023-10-27 | Discharge: 2023-10-27 | Disposition: A | Discharge status: Home / Self Care | Payer: Self-pay | Source: Ambulatory Visit | Attending: Physician Assistant | Admitting: Physician Assistant

## 2023-12-01 ENCOUNTER — Ambulatory Visit: Admitting: Pulmonary Disease

## 2024-02-24 ENCOUNTER — Other Ambulatory Visit: Payer: Self-pay | Admitting: Internal Medicine

## 2024-02-27 ENCOUNTER — Other Ambulatory Visit: Payer: Self-pay

## 2024-02-27 DIAGNOSIS — E063 Autoimmune thyroiditis: Secondary | ICD-10-CM

## 2024-02-27 NOTE — Telephone Encounter (Signed)
 LMTCB. Need to let pt know that her thyroid  medication has been refilled but she is due to have her thyroid  level rechecked. Please schedule pt for a non fasting lab appt. Lab order has been placed.

## 2024-03-10 ENCOUNTER — Ambulatory Visit
Admission: RE | Admit: 2024-03-10 | Discharge: 2024-03-10 | Disposition: A | Discharge status: Home / Self Care | Source: Ambulatory Visit | Attending: Internal Medicine | Admitting: Internal Medicine

## 2024-03-10 DIAGNOSIS — Z1231 Encounter for screening mammogram for malignant neoplasm of breast: Secondary | ICD-10-CM | POA: Diagnosis present

## 2024-03-17 ENCOUNTER — Other Ambulatory Visit (INDEPENDENT_AMBULATORY_CARE_PROVIDER_SITE_OTHER)

## 2024-03-17 DIAGNOSIS — E063 Autoimmune thyroiditis: Secondary | ICD-10-CM

## 2024-03-17 LAB — TSH: TSH: 1.38 u[IU]/mL (ref 0.35–5.50)

## 2024-03-18 ENCOUNTER — Ambulatory Visit: Payer: Self-pay | Admitting: Internal Medicine

## 2024-05-26 ENCOUNTER — Other Ambulatory Visit: Payer: Self-pay | Admitting: Internal Medicine
